# Patient Record
Sex: Female | Born: 1965
Health system: Southern US, Community
[De-identification: ages and names within clinical notes are randomized; demographics above are authoritative.]

## PROBLEM LIST (undated history)

## (undated) DIAGNOSIS — E7800 Pure hypercholesterolemia, unspecified: Secondary | ICD-10-CM

## (undated) DIAGNOSIS — A6000 Herpesviral infection of urogenital system, unspecified: Secondary | ICD-10-CM

## (undated) DIAGNOSIS — E079 Disorder of thyroid, unspecified: Secondary | ICD-10-CM

## (undated) DIAGNOSIS — T883XXA Malignant hyperthermia due to anesthesia, initial encounter: Secondary | ICD-10-CM

## (undated) DIAGNOSIS — H409 Unspecified glaucoma: Secondary | ICD-10-CM

## (undated) DIAGNOSIS — K219 Gastro-esophageal reflux disease without esophagitis: Secondary | ICD-10-CM

## (undated) DIAGNOSIS — M1990 Unspecified osteoarthritis, unspecified site: Secondary | ICD-10-CM

## (undated) DIAGNOSIS — F32A Depression, unspecified: Secondary | ICD-10-CM

## (undated) DIAGNOSIS — I10 Essential (primary) hypertension: Secondary | ICD-10-CM

## (undated) DIAGNOSIS — F329 Major depressive disorder, single episode, unspecified: Secondary | ICD-10-CM

## (undated) DIAGNOSIS — D219 Benign neoplasm of connective and other soft tissue, unspecified: Secondary | ICD-10-CM

## (undated) DIAGNOSIS — Z8489 Family history of other specified conditions: Secondary | ICD-10-CM

## (undated) HISTORY — DX: Gastro-esophageal reflux disease without esophagitis: K21.9

## (undated) HISTORY — DX: Depression, unspecified: F32.A

## (undated) HISTORY — DX: Disorder of thyroid, unspecified: E07.9

## (undated) HISTORY — DX: Pure hypercholesterolemia, unspecified: E78.00

## (undated) HISTORY — PX: ESOPHAGOGASTRODUODENOSCOPY: SHX1529

## (undated) HISTORY — DX: Benign neoplasm of connective and other soft tissue, unspecified: D21.9

## (undated) HISTORY — DX: Unspecified glaucoma: H40.9

## (undated) HISTORY — DX: Essential (primary) hypertension: I10

## (undated) HISTORY — DX: Herpesviral infection of urogenital system, unspecified: A60.00

## (undated) HISTORY — PX: CYSTOSCOPY: SUR368

## (undated) HISTORY — DX: Major depressive disorder, single episode, unspecified: F32.9

---

## 2007-09-11 ENCOUNTER — Ambulatory Visit: Payer: Self-pay | Admitting: Unknown Physician Specialty

## 2008-09-09 ENCOUNTER — Ambulatory Visit: Payer: Self-pay | Admitting: Unknown Physician Specialty

## 2008-09-14 ENCOUNTER — Ambulatory Visit: Payer: Self-pay | Admitting: Unknown Physician Specialty

## 2008-09-18 ENCOUNTER — Ambulatory Visit: Payer: Self-pay | Admitting: Unknown Physician Specialty

## 2009-10-15 ENCOUNTER — Ambulatory Visit: Payer: Self-pay | Admitting: Unknown Physician Specialty

## 2010-08-03 ENCOUNTER — Emergency Department: Payer: Self-pay | Admitting: Unknown Physician Specialty

## 2010-11-01 ENCOUNTER — Ambulatory Visit: Payer: Self-pay | Admitting: Internal Medicine

## 2011-11-02 ENCOUNTER — Ambulatory Visit: Payer: Self-pay | Admitting: Internal Medicine

## 2011-11-07 ENCOUNTER — Ambulatory Visit: Payer: Self-pay | Admitting: Internal Medicine

## 2011-11-14 ENCOUNTER — Ambulatory Visit: Payer: Self-pay | Admitting: Psychiatry

## 2012-04-07 ENCOUNTER — Emergency Department: Payer: Self-pay | Admitting: Emergency Medicine

## 2012-11-04 ENCOUNTER — Ambulatory Visit: Payer: Self-pay | Admitting: Internal Medicine

## 2012-11-05 ENCOUNTER — Ambulatory Visit: Payer: Self-pay | Admitting: Internal Medicine

## 2014-01-02 ENCOUNTER — Ambulatory Visit: Payer: Self-pay | Admitting: Internal Medicine

## 2014-01-26 DIAGNOSIS — G44221 Chronic tension-type headache, intractable: Secondary | ICD-10-CM | POA: Insufficient documentation

## 2014-02-03 DIAGNOSIS — E7800 Pure hypercholesterolemia, unspecified: Secondary | ICD-10-CM | POA: Insufficient documentation

## 2014-02-24 HISTORY — PX: COLONOSCOPY: SHX174

## 2014-03-16 ENCOUNTER — Ambulatory Visit: Payer: Self-pay | Admitting: Unknown Physician Specialty

## 2014-03-18 ENCOUNTER — Ambulatory Visit: Payer: Self-pay | Admitting: Neurology

## 2014-07-20 LAB — SURGICAL PATHOLOGY

## 2014-11-12 LAB — HM PAP SMEAR: HM Pap smear: NEGATIVE

## 2014-12-21 ENCOUNTER — Other Ambulatory Visit: Payer: Self-pay | Admitting: Internal Medicine

## 2014-12-21 DIAGNOSIS — Z1231 Encounter for screening mammogram for malignant neoplasm of breast: Secondary | ICD-10-CM

## 2015-01-04 ENCOUNTER — Ambulatory Visit
Admission: RE | Admit: 2015-01-04 | Discharge: 2015-01-04 | Disposition: A | Discharge status: Home / Self Care | Payer: 59 | Source: Ambulatory Visit | Attending: Internal Medicine | Admitting: Internal Medicine

## 2015-01-04 DIAGNOSIS — Z1231 Encounter for screening mammogram for malignant neoplasm of breast: Secondary | ICD-10-CM | POA: Insufficient documentation

## 2015-03-23 DIAGNOSIS — R739 Hyperglycemia, unspecified: Secondary | ICD-10-CM | POA: Insufficient documentation

## 2015-04-14 DIAGNOSIS — H524 Presbyopia: Secondary | ICD-10-CM | POA: Diagnosis not present

## 2015-04-17 DIAGNOSIS — G5601 Carpal tunnel syndrome, right upper limb: Secondary | ICD-10-CM | POA: Diagnosis not present

## 2015-05-18 DIAGNOSIS — G5601 Carpal tunnel syndrome, right upper limb: Secondary | ICD-10-CM | POA: Diagnosis not present

## 2015-06-09 DIAGNOSIS — F341 Dysthymic disorder: Secondary | ICD-10-CM | POA: Diagnosis not present

## 2015-06-15 DIAGNOSIS — G5601 Carpal tunnel syndrome, right upper limb: Secondary | ICD-10-CM | POA: Diagnosis not present

## 2015-06-16 DIAGNOSIS — F341 Dysthymic disorder: Secondary | ICD-10-CM | POA: Diagnosis not present

## 2015-06-30 DIAGNOSIS — F341 Dysthymic disorder: Secondary | ICD-10-CM | POA: Diagnosis not present

## 2015-07-14 DIAGNOSIS — F341 Dysthymic disorder: Secondary | ICD-10-CM | POA: Diagnosis not present

## 2015-07-16 DIAGNOSIS — G5601 Carpal tunnel syndrome, right upper limb: Secondary | ICD-10-CM | POA: Diagnosis not present

## 2015-08-11 DIAGNOSIS — F341 Dysthymic disorder: Secondary | ICD-10-CM | POA: Diagnosis not present

## 2015-08-15 DIAGNOSIS — G5601 Carpal tunnel syndrome, right upper limb: Secondary | ICD-10-CM | POA: Diagnosis not present

## 2015-08-25 DIAGNOSIS — I10 Essential (primary) hypertension: Secondary | ICD-10-CM | POA: Diagnosis not present

## 2015-08-25 DIAGNOSIS — Z79899 Other long term (current) drug therapy: Secondary | ICD-10-CM | POA: Diagnosis not present

## 2015-08-25 DIAGNOSIS — R739 Hyperglycemia, unspecified: Secondary | ICD-10-CM | POA: Diagnosis not present

## 2015-08-25 DIAGNOSIS — E7800 Pure hypercholesterolemia, unspecified: Secondary | ICD-10-CM | POA: Diagnosis not present

## 2015-09-09 DIAGNOSIS — L84 Corns and callosities: Secondary | ICD-10-CM | POA: Diagnosis not present

## 2015-09-09 DIAGNOSIS — L304 Erythema intertrigo: Secondary | ICD-10-CM | POA: Diagnosis not present

## 2015-09-09 DIAGNOSIS — L918 Other hypertrophic disorders of the skin: Secondary | ICD-10-CM | POA: Diagnosis not present

## 2015-09-22 DIAGNOSIS — F341 Dysthymic disorder: Secondary | ICD-10-CM | POA: Diagnosis not present

## 2015-10-14 DIAGNOSIS — H40053 Ocular hypertension, bilateral: Secondary | ICD-10-CM | POA: Diagnosis not present

## 2015-10-27 DIAGNOSIS — F341 Dysthymic disorder: Secondary | ICD-10-CM | POA: Diagnosis not present

## 2015-11-30 DIAGNOSIS — F341 Dysthymic disorder: Secondary | ICD-10-CM | POA: Diagnosis not present

## 2015-12-13 DIAGNOSIS — Z3041 Encounter for surveillance of contraceptive pills: Secondary | ICD-10-CM | POA: Diagnosis not present

## 2015-12-13 DIAGNOSIS — D219 Benign neoplasm of connective and other soft tissue, unspecified: Secondary | ICD-10-CM | POA: Diagnosis not present

## 2015-12-13 DIAGNOSIS — Z01419 Encounter for gynecological examination (general) (routine) without abnormal findings: Secondary | ICD-10-CM | POA: Diagnosis not present

## 2015-12-13 DIAGNOSIS — Z1239 Encounter for other screening for malignant neoplasm of breast: Secondary | ICD-10-CM | POA: Diagnosis not present

## 2016-01-03 DIAGNOSIS — F341 Dysthymic disorder: Secondary | ICD-10-CM | POA: Diagnosis not present

## 2016-01-06 DIAGNOSIS — H40053 Ocular hypertension, bilateral: Secondary | ICD-10-CM | POA: Diagnosis not present

## 2016-01-17 DIAGNOSIS — F341 Dysthymic disorder: Secondary | ICD-10-CM | POA: Diagnosis not present

## 2016-01-31 DIAGNOSIS — F341 Dysthymic disorder: Secondary | ICD-10-CM | POA: Diagnosis not present

## 2016-02-24 DIAGNOSIS — F341 Dysthymic disorder: Secondary | ICD-10-CM | POA: Diagnosis not present

## 2016-02-24 DIAGNOSIS — Z01419 Encounter for gynecological examination (general) (routine) without abnormal findings: Secondary | ICD-10-CM | POA: Diagnosis not present

## 2016-03-01 ENCOUNTER — Other Ambulatory Visit: Payer: Self-pay | Admitting: Internal Medicine

## 2016-03-06 DIAGNOSIS — J069 Acute upper respiratory infection, unspecified: Secondary | ICD-10-CM | POA: Diagnosis not present

## 2016-03-13 DIAGNOSIS — F341 Dysthymic disorder: Secondary | ICD-10-CM | POA: Diagnosis not present

## 2016-03-13 DIAGNOSIS — J069 Acute upper respiratory infection, unspecified: Secondary | ICD-10-CM | POA: Diagnosis not present

## 2016-04-03 DIAGNOSIS — F341 Dysthymic disorder: Secondary | ICD-10-CM | POA: Diagnosis not present

## 2016-05-08 DIAGNOSIS — F341 Dysthymic disorder: Secondary | ICD-10-CM | POA: Diagnosis not present

## 2016-05-24 DIAGNOSIS — H524 Presbyopia: Secondary | ICD-10-CM | POA: Diagnosis not present

## 2016-06-08 ENCOUNTER — Other Ambulatory Visit
Admission: RE | Admit: 2016-06-08 | Discharge: 2016-06-08 | Disposition: A | Discharge status: Home / Self Care | Payer: 59 | Source: Ambulatory Visit | Attending: Psychiatry | Admitting: Psychiatry

## 2016-06-08 DIAGNOSIS — F332 Major depressive disorder, recurrent severe without psychotic features: Secondary | ICD-10-CM | POA: Insufficient documentation

## 2016-06-08 DIAGNOSIS — F341 Dysthymic disorder: Secondary | ICD-10-CM | POA: Diagnosis not present

## 2016-06-08 DIAGNOSIS — I10 Essential (primary) hypertension: Secondary | ICD-10-CM | POA: Diagnosis not present

## 2016-06-08 LAB — LIPID PANEL
CHOL/HDL RATIO: 3.2 ratio
Cholesterol: 198 mg/dL (ref 0–200)
HDL: 62 mg/dL (ref >40)
LDL CALC: 117 mg/dL — AB (ref 0–99)
Triglycerides: 93 mg/dL (ref <150)
VLDL: 19 mg/dL (ref 0–40)

## 2016-06-08 LAB — TSH: TSH: 5.489 u[IU]/mL — ABNORMAL HIGH (ref 0.350–4.500)

## 2016-06-08 LAB — COMPREHENSIVE METABOLIC PANEL
ALBUMIN: 3.8 g/dL (ref 3.5–5.0)
ALT: 29 U/L (ref 14–54)
AST: 23 U/L (ref 15–41)
Alkaline Phosphatase: 44 U/L (ref 38–126)
Anion gap: 8 (ref 5–15)
BUN: 15 mg/dL (ref 6–20)
CHLORIDE: 102 mmol/L (ref 101–111)
CO2: 28 mmol/L (ref 22–32)
Calcium: 8.9 mg/dL (ref 8.9–10.3)
Creatinine, Ser: 0.99 mg/dL (ref 0.44–1.00)
GFR calc Af Amer: >60 mL/min (ref >60)
GFR calc non Af Amer: >60 mL/min (ref >60)
GLUCOSE: 103 mg/dL — AB (ref 65–99)
POTASSIUM: 3.1 mmol/L — AB (ref 3.5–5.1)
Sodium: 138 mmol/L (ref 135–145)
Total Bilirubin: 0.4 mg/dL (ref 0.3–1.2)
Total Protein: 7.4 g/dL (ref 6.5–8.1)

## 2016-06-08 LAB — CBC WITH DIFFERENTIAL/PLATELET
BASOS PCT: 1 %
Basophils Absolute: 0.1 10*3/uL (ref 0–0.1)
EOS ABS: 0.2 10*3/uL (ref 0–0.7)
Eosinophils Relative: 2 %
HCT: 37.1 % (ref 35.0–47.0)
HEMOGLOBIN: 12.3 g/dL (ref 12.0–16.0)
Lymphocytes Relative: 22 %
Lymphs Abs: 2.1 10*3/uL (ref 1.0–3.6)
MCH: 27 pg (ref 26.0–34.0)
MCHC: 33.1 g/dL (ref 32.0–36.0)
MCV: 81.7 fL (ref 80.0–100.0)
Monocytes Absolute: 0.6 10*3/uL (ref 0.2–0.9)
Monocytes Relative: 7 %
NEUTROS PCT: 68 %
Neutro Abs: 6.3 10*3/uL (ref 1.4–6.5)
Platelets: 382 10*3/uL (ref 150–440)
RBC: 4.55 MIL/uL (ref 3.80–5.20)
RDW: 14.1 % (ref 11.5–14.5)
WBC: 9.3 10*3/uL (ref 3.6–11.0)

## 2016-06-26 DIAGNOSIS — F341 Dysthymic disorder: Secondary | ICD-10-CM | POA: Diagnosis not present

## 2016-07-17 DIAGNOSIS — F341 Dysthymic disorder: Secondary | ICD-10-CM | POA: Diagnosis not present

## 2016-08-15 DIAGNOSIS — F341 Dysthymic disorder: Secondary | ICD-10-CM | POA: Diagnosis not present

## 2016-09-26 DIAGNOSIS — M1990 Unspecified osteoarthritis, unspecified site: Secondary | ICD-10-CM | POA: Diagnosis not present

## 2016-09-26 DIAGNOSIS — I10 Essential (primary) hypertension: Secondary | ICD-10-CM | POA: Diagnosis not present

## 2016-09-26 DIAGNOSIS — Z0000 Encounter for general adult medical examination without abnormal findings: Secondary | ICD-10-CM | POA: Diagnosis not present

## 2016-09-26 DIAGNOSIS — G44221 Chronic tension-type headache, intractable: Secondary | ICD-10-CM | POA: Diagnosis not present

## 2016-09-26 DIAGNOSIS — Z1329 Encounter for screening for other suspected endocrine disorder: Secondary | ICD-10-CM | POA: Diagnosis not present

## 2016-09-26 DIAGNOSIS — R739 Hyperglycemia, unspecified: Secondary | ICD-10-CM | POA: Diagnosis not present

## 2016-09-26 DIAGNOSIS — Z79899 Other long term (current) drug therapy: Secondary | ICD-10-CM | POA: Diagnosis not present

## 2016-09-26 DIAGNOSIS — M2550 Pain in unspecified joint: Secondary | ICD-10-CM | POA: Diagnosis not present

## 2016-09-28 ENCOUNTER — Other Ambulatory Visit: Payer: Self-pay | Admitting: Internal Medicine

## 2016-09-28 DIAGNOSIS — Z1231 Encounter for screening mammogram for malignant neoplasm of breast: Secondary | ICD-10-CM

## 2016-10-10 DIAGNOSIS — M1990 Unspecified osteoarthritis, unspecified site: Secondary | ICD-10-CM | POA: Insufficient documentation

## 2016-10-10 DIAGNOSIS — M19041 Primary osteoarthritis, right hand: Secondary | ICD-10-CM | POA: Diagnosis not present

## 2016-10-10 DIAGNOSIS — M79641 Pain in right hand: Secondary | ICD-10-CM | POA: Diagnosis not present

## 2016-10-10 DIAGNOSIS — M79642 Pain in left hand: Secondary | ICD-10-CM | POA: Diagnosis not present

## 2016-10-10 DIAGNOSIS — M19042 Primary osteoarthritis, left hand: Secondary | ICD-10-CM | POA: Diagnosis not present

## 2016-10-13 ENCOUNTER — Ambulatory Visit
Admission: RE | Admit: 2016-10-13 | Discharge: 2016-10-13 | Disposition: A | Discharge status: Home / Self Care | Payer: 59 | Source: Ambulatory Visit | Attending: Internal Medicine | Admitting: Internal Medicine

## 2016-10-13 ENCOUNTER — Other Ambulatory Visit: Payer: Self-pay | Admitting: Internal Medicine

## 2016-10-13 DIAGNOSIS — Z1231 Encounter for screening mammogram for malignant neoplasm of breast: Secondary | ICD-10-CM | POA: Insufficient documentation

## 2016-10-13 LAB — HM MAMMOGRAPHY: HM Mammogram: NORMAL (ref 0–4)

## 2016-10-16 DIAGNOSIS — F341 Dysthymic disorder: Secondary | ICD-10-CM | POA: Diagnosis not present

## 2016-11-15 DIAGNOSIS — I10 Essential (primary) hypertension: Secondary | ICD-10-CM | POA: Diagnosis not present

## 2016-11-23 DIAGNOSIS — F341 Dysthymic disorder: Secondary | ICD-10-CM | POA: Diagnosis not present

## 2016-12-04 ENCOUNTER — Telehealth: Payer: Self-pay | Admitting: Obstetrics and Gynecology

## 2016-12-04 ENCOUNTER — Other Ambulatory Visit: Payer: Self-pay | Admitting: Obstetrics and Gynecology

## 2016-12-04 MED ORDER — NORETHINDRONE 0.35 MG PO TABS: 1.0000 | ORAL_TABLET | Freq: Every day | ORAL | 0 refills | Status: DC

## 2016-12-04 NOTE — Telephone Encounter (Signed)
Please advise 

## 2016-12-04 NOTE — Telephone Encounter (Signed)
norethindrone (contraceptive) 0.35 mg oral tablet was rx'd 12/13/15 at last AE. Not in EPIC to refill. Please Rx.

## 2016-12-04 NOTE — Telephone Encounter (Signed)
Pt is schedule 12/14/16 with [facility] and is requesting an refill o her Birthcontrol. [facility]

## 2016-12-04 NOTE — Telephone Encounter (Signed)
Rx eRxd.  

## 2016-12-04 NOTE — Progress Notes (Signed)
POP RF till annual.

## 2016-12-06 DIAGNOSIS — H40053 Ocular hypertension, bilateral: Secondary | ICD-10-CM | POA: Diagnosis not present

## 2016-12-14 ENCOUNTER — Other Ambulatory Visit: Payer: Self-pay

## 2016-12-14 ENCOUNTER — Ambulatory Visit: Payer: Self-pay | Admitting: Obstetrics and Gynecology

## 2016-12-14 MED ORDER — NORETHINDRONE 0.35 MG PO TABS: 1.0000 | ORAL_TABLET | Freq: Every day | ORAL | 0 refills | Status: DC

## 2016-12-14 NOTE — Telephone Encounter (Signed)
Refilled OCP's to last to pt annual 10/25

## 2016-12-18 DIAGNOSIS — F341 Dysthymic disorder: Secondary | ICD-10-CM | POA: Diagnosis not present

## 2016-12-28 ENCOUNTER — Telehealth: Payer: Self-pay

## 2016-12-28 MED ORDER — NORETHINDRONE 0.35 MG PO TABS: 1.0000 | ORAL_TABLET | Freq: Every day | ORAL | 0 refills | Status: DC

## 2016-12-28 NOTE — Telephone Encounter (Signed)
Pt's annual was resch to 10/24th and needs bc refill to them.  Pt aware refill eRx'd.

## 2017-01-15 DIAGNOSIS — F341 Dysthymic disorder: Secondary | ICD-10-CM | POA: Diagnosis not present

## 2017-01-18 ENCOUNTER — Encounter: Payer: Self-pay | Admitting: Obstetrics and Gynecology

## 2017-01-18 ENCOUNTER — Ambulatory Visit (INDEPENDENT_AMBULATORY_CARE_PROVIDER_SITE_OTHER): Payer: 59 | Admitting: Obstetrics and Gynecology

## 2017-01-18 VITALS — BP 136/82 | HR 71 | Ht 60.0 in | Wt 191.0 lb

## 2017-01-18 DIAGNOSIS — Z3041 Encounter for surveillance of contraceptive pills: Secondary | ICD-10-CM

## 2017-01-18 DIAGNOSIS — D219 Benign neoplasm of connective and other soft tissue, unspecified: Secondary | ICD-10-CM | POA: Diagnosis not present

## 2017-01-18 DIAGNOSIS — Z113 Encounter for screening for infections with a predominantly sexual mode of transmission: Secondary | ICD-10-CM

## 2017-01-18 DIAGNOSIS — Z1239 Encounter for other screening for malignant neoplasm of breast: Secondary | ICD-10-CM

## 2017-01-18 DIAGNOSIS — Z1231 Encounter for screening mammogram for malignant neoplasm of breast: Secondary | ICD-10-CM | POA: Diagnosis not present

## 2017-01-18 DIAGNOSIS — Z01419 Encounter for gynecological examination (general) (routine) without abnormal findings: Secondary | ICD-10-CM

## 2017-01-18 MED ORDER — NORETHINDRONE 0.35 MG PO TABS: 1.0000 | ORAL_TABLET | Freq: Every day | ORAL | 12 refills | Status: DC

## 2017-01-18 NOTE — Progress Notes (Signed)
PCP:  Idelle Crouch, MD   Chief Complaint  Patient presents with  . Gynecologic Exam     HPI:      [name] is a 51 y.o. G0P0000 who LMP was No LMP recorded. Patient is perimenopausal., presents today for her annual examination.  Her menses are regular every 28-30 days, lasting 6 days total but only 3 days of med flow bleeding.  Dysmenorrhea mild, occurring first 1-2 days of flow. She does not have intermenstrual bleeding. Pt on camila for cycle control with leio.  Sex activity: not sexually active.  Last Pap: November 12, 2014  Results were: no abnormalities /neg HPV DNA  Hx of STDs: HSV on labs only, never any sx. She would like full STD testing today just to be safe.  Last mammogram: October 13, 2016  with PCP. Results were: normal--routine follow-up in 12 months There is no FH of breast cancer. There is no FH of ovarian cancer. The patient does do self-breast exams.  Tobacco use: The patient denies current or previous tobacco use. Alcohol use: social drinker No drug use.  Exercise: not active  She does get adequate calcium but not Vitamin D in her diet.  Labs with PCP. Seeing psych for depression/medications.    Past Medical History:  Diagnosis Date  . Depression   . Esophageal reflux   . Fibroids   . Glaucoma   . Herpes genitalia   . Hypercholesteremia   . Hypertension   . Thyroid disease     Past Surgical History:  Procedure Laterality Date  . COLONOSCOPY  02/2014   Dr.Elliott    Family History  Problem Relation Age of Onset  . Dementia Mother   . Hypertension Mother     Social History   Social History  . Marital status: Single    Spouse name: N/A  . Number of children: N/A  . Years of education: N/A   Occupational History  . Not on file.   Social History Main Topics  . Smoking status: Never Smoker  . Smokeless tobacco: Never Used  . Alcohol use Yes  . Drug use: No  . Sexual activity: Not Currently    Partners: Male    Birth  control/ protection: None   Other Topics Concern  . Not on file   Social History Narrative  . No narrative on file    Current Meds  Medication Sig  . ALPRAZolam (XANAX) 0.25 MG tablet   . buPROPion (WELLBUTRIN SR) 150 MG 12 hr tablet Take two pills in the morning, one at night.  . etodolac (LODINE) 400 MG tablet Take by mouth.  . methocarbamol (ROBAXIN) 500 MG tablet Take by mouth.  . norethindrone (MICRONOR,CAMILA,ERRIN) 0.35 MG tablet Take 1 tablet (0.35 mg total) by mouth daily.  Marland Kitchen omeprazole (PRILOSEC) 20 MG capsule Take by mouth.  . propranolol ER (INDERAL LA) 60 MG 24 hr capsule   . sertraline (ZOLOFT) 50 MG tablet Take by mouth.  . simvastatin (ZOCOR) 40 MG tablet   . travoprost, benzalkonium, (TRAVATAN) 0.004 % ophthalmic solution Apply to eye.  . [DISCONTINUED] norethindrone (MICRONOR,CAMILA,ERRIN) 0.35 MG tablet Take 1 tablet (0.35 mg total) by mouth daily.  . [DISCONTINUED] norethindrone (MICRONOR,CAMILA,ERRIN) 0.35 MG tablet Take by mouth.     ROS:  Review of Systems  Constitutional: Negative for fatigue, fever and unexpected weight change.  Respiratory: Negative for cough, shortness of breath and wheezing.   Cardiovascular: Negative for chest pain, palpitations and leg swelling.  Gastrointestinal: Negative for blood in stool, constipation, diarrhea, nausea and vomiting.  Endocrine: Negative for cold intolerance, heat intolerance and polyuria.  Genitourinary: Negative for dyspareunia, dysuria, flank pain, frequency, genital sores, hematuria, menstrual problem, pelvic pain, urgency, vaginal bleeding, vaginal discharge and vaginal pain.  Musculoskeletal: Negative for back pain, joint swelling and myalgias.  Skin: Negative for rash.  Neurological: Negative for dizziness, syncope, light-headedness, numbness and headaches.  Hematological: Negative for adenopathy.  Psychiatric/Behavioral: Positive for dysphoric mood. Negative for agitation, confusion, sleep disturbance  and suicidal ideas. The patient is not nervous/anxious.      Objective: BP 136/82 (BP Location: Left Arm, Patient Position: Sitting, Cuff Size: Normal)   Pulse 71   Ht 5' (1.524 m)   Wt 191 lb (86.6 kg)   BMI 37.30 kg/m    Physical Exam  Constitutional: She is oriented to person, place, and time. She appears well-developed and well-nourished.  Genitourinary: Vagina normal and uterus normal. There is no rash or tenderness on the right labia. There is no rash or tenderness on the left labia. No erythema or tenderness in the vagina. No vaginal discharge found. Right adnexum does not display mass and does not display tenderness. Left adnexum does not display mass and does not display tenderness. Cervix does not exhibit motion tenderness or polyp. Uterus is not enlarged or tender.  Neck: Normal range of motion. No thyromegaly present.  Cardiovascular: Normal rate, regular rhythm and normal heart sounds.   No murmur heard. Pulmonary/Chest: Effort normal and breath sounds normal. Right breast exhibits no mass, no nipple discharge, no skin change and no tenderness. Left breast exhibits no mass, no nipple discharge, no skin change and no tenderness.  Abdominal: Soft. There is no tenderness. There is no guarding.  Musculoskeletal: Normal range of motion.  Neurological: She is alert and oriented to person, place, and time. No cranial nerve deficit.  Psychiatric: She has a normal mood and affect. Her behavior is normal.  Vitals reviewed.  RESULTS:  GAD-7=2 PHQ-9=7  Assessment/Plan: Encounter for annual routine gynecological examination  Screening for breast cancer - Pt current on mammo through PCP  Encounter for surveillance of contraceptive pills - OCP RF. - Plan: norethindrone (MICRONOR,CAMILA,ERRIN) 0.35 MG tablet  Screening for STD (sexually transmitted disease) - Plan: HIV antibody, RPR, Hepatitis C antibody, Chlamydia/Gonococcus/Trichomonas, NAA  Leiomyoma - Bleeding controlled with  POPs           GYN counsel breast self exam, mammography screening, adequate intake of calcium and vitamin D, diet and exercise     F/U  Return in about 1 year (around 01/18/2018).  Leverne Tessler B. Darlisha Kelm, PA-C 01/18/2017 10:29 AM

## 2017-01-19 LAB — HEPATITIS C ANTIBODY

## 2017-01-19 LAB — HIV ANTIBODY (ROUTINE TESTING W REFLEX): HIV SCREEN 4TH GENERATION: NONREACTIVE

## 2017-01-19 LAB — RPR: RPR Ser Ql: NONREACTIVE

## 2017-01-20 ENCOUNTER — Encounter: Payer: Self-pay | Admitting: Obstetrics and Gynecology

## 2017-01-20 LAB — CHLAMYDIA/GONOCOCCUS/TRICHOMONAS, NAA
CHLAMYDIA BY NAA: NEGATIVE
Gonococcus by NAA: NEGATIVE
Trich vag by NAA: NEGATIVE

## 2017-02-12 DIAGNOSIS — F341 Dysthymic disorder: Secondary | ICD-10-CM | POA: Diagnosis not present

## 2017-02-21 DIAGNOSIS — I10 Essential (primary) hypertension: Secondary | ICD-10-CM | POA: Diagnosis not present

## 2017-02-21 DIAGNOSIS — E7800 Pure hypercholesterolemia, unspecified: Secondary | ICD-10-CM | POA: Diagnosis not present

## 2017-02-21 DIAGNOSIS — M79641 Pain in right hand: Secondary | ICD-10-CM | POA: Diagnosis not present

## 2017-02-21 DIAGNOSIS — R739 Hyperglycemia, unspecified: Secondary | ICD-10-CM | POA: Diagnosis not present

## 2017-02-22 DIAGNOSIS — R002 Palpitations: Secondary | ICD-10-CM | POA: Diagnosis not present

## 2017-02-22 DIAGNOSIS — E7800 Pure hypercholesterolemia, unspecified: Secondary | ICD-10-CM | POA: Diagnosis not present

## 2017-02-22 DIAGNOSIS — R0789 Other chest pain: Secondary | ICD-10-CM | POA: Diagnosis not present

## 2017-02-22 DIAGNOSIS — I10 Essential (primary) hypertension: Secondary | ICD-10-CM | POA: Diagnosis not present

## 2017-02-23 ENCOUNTER — Other Ambulatory Visit: Payer: Self-pay | Admitting: Internal Medicine

## 2017-02-23 DIAGNOSIS — R0789 Other chest pain: Secondary | ICD-10-CM

## 2017-02-26 DIAGNOSIS — M19041 Primary osteoarthritis, right hand: Secondary | ICD-10-CM | POA: Diagnosis not present

## 2017-02-26 DIAGNOSIS — M19042 Primary osteoarthritis, left hand: Secondary | ICD-10-CM | POA: Diagnosis not present

## 2017-03-01 ENCOUNTER — Ambulatory Visit
Admission: RE | Admit: 2017-03-01 | Discharge: 2017-03-01 | Disposition: A | Discharge status: Home / Self Care | Payer: 59 | Source: Ambulatory Visit | Attending: Internal Medicine | Admitting: Internal Medicine

## 2017-03-01 DIAGNOSIS — R0789 Other chest pain: Secondary | ICD-10-CM | POA: Insufficient documentation

## 2017-03-12 DIAGNOSIS — R0789 Other chest pain: Secondary | ICD-10-CM | POA: Diagnosis not present

## 2017-03-12 DIAGNOSIS — F341 Dysthymic disorder: Secondary | ICD-10-CM | POA: Diagnosis not present

## 2017-03-12 DIAGNOSIS — I10 Essential (primary) hypertension: Secondary | ICD-10-CM | POA: Diagnosis not present

## 2017-03-12 DIAGNOSIS — E7800 Pure hypercholesterolemia, unspecified: Secondary | ICD-10-CM | POA: Diagnosis not present

## 2017-04-09 DIAGNOSIS — F341 Dysthymic disorder: Secondary | ICD-10-CM | POA: Diagnosis not present

## 2017-04-26 LAB — EXERCISE TOLERANCE TEST
CSEPEW: 7.6 METS
Exercise duration (min): 6 min
Exercise duration (sec): 27 s
MPHR: 172 {beats}/min
Peak HR: 148 {beats}/min
Percent HR: 86 %
Rest HR: 72 {beats}/min

## 2017-05-17 DIAGNOSIS — I10 Essential (primary) hypertension: Secondary | ICD-10-CM | POA: Diagnosis not present

## 2017-05-17 DIAGNOSIS — Z79899 Other long term (current) drug therapy: Secondary | ICD-10-CM | POA: Diagnosis not present

## 2017-05-17 DIAGNOSIS — E7800 Pure hypercholesterolemia, unspecified: Secondary | ICD-10-CM | POA: Diagnosis not present

## 2017-05-17 DIAGNOSIS — R739 Hyperglycemia, unspecified: Secondary | ICD-10-CM | POA: Diagnosis not present

## 2017-05-24 DIAGNOSIS — F341 Dysthymic disorder: Secondary | ICD-10-CM | POA: Diagnosis not present

## 2017-05-24 DIAGNOSIS — I10 Essential (primary) hypertension: Secondary | ICD-10-CM | POA: Diagnosis not present

## 2017-05-24 DIAGNOSIS — R739 Hyperglycemia, unspecified: Secondary | ICD-10-CM | POA: Diagnosis not present

## 2017-05-24 DIAGNOSIS — M545 Low back pain: Secondary | ICD-10-CM | POA: Diagnosis not present

## 2017-05-24 DIAGNOSIS — E7800 Pure hypercholesterolemia, unspecified: Secondary | ICD-10-CM | POA: Diagnosis not present

## 2017-05-31 DIAGNOSIS — H524 Presbyopia: Secondary | ICD-10-CM | POA: Diagnosis not present

## 2017-06-21 DIAGNOSIS — F341 Dysthymic disorder: Secondary | ICD-10-CM | POA: Diagnosis not present

## 2017-08-13 DIAGNOSIS — F341 Dysthymic disorder: Secondary | ICD-10-CM | POA: Diagnosis not present

## 2017-08-21 DIAGNOSIS — E7800 Pure hypercholesterolemia, unspecified: Secondary | ICD-10-CM | POA: Diagnosis not present

## 2017-08-21 DIAGNOSIS — I10 Essential (primary) hypertension: Secondary | ICD-10-CM | POA: Diagnosis not present

## 2017-08-21 DIAGNOSIS — R739 Hyperglycemia, unspecified: Secondary | ICD-10-CM | POA: Diagnosis not present

## 2017-08-21 DIAGNOSIS — K219 Gastro-esophageal reflux disease without esophagitis: Secondary | ICD-10-CM | POA: Diagnosis not present

## 2017-08-27 DIAGNOSIS — M25562 Pain in left knee: Secondary | ICD-10-CM | POA: Diagnosis not present

## 2017-08-27 DIAGNOSIS — M19041 Primary osteoarthritis, right hand: Secondary | ICD-10-CM | POA: Diagnosis not present

## 2017-08-27 DIAGNOSIS — M19042 Primary osteoarthritis, left hand: Secondary | ICD-10-CM | POA: Diagnosis not present

## 2017-08-27 DIAGNOSIS — M1712 Unilateral primary osteoarthritis, left knee: Secondary | ICD-10-CM | POA: Diagnosis not present

## 2017-09-12 DIAGNOSIS — I10 Essential (primary) hypertension: Secondary | ICD-10-CM | POA: Diagnosis not present

## 2017-09-12 DIAGNOSIS — E7800 Pure hypercholesterolemia, unspecified: Secondary | ICD-10-CM | POA: Diagnosis not present

## 2017-09-20 DIAGNOSIS — L821 Other seborrheic keratosis: Secondary | ICD-10-CM | POA: Diagnosis not present

## 2017-09-20 DIAGNOSIS — L304 Erythema intertrigo: Secondary | ICD-10-CM | POA: Diagnosis not present

## 2017-09-24 DIAGNOSIS — F331 Major depressive disorder, recurrent, moderate: Secondary | ICD-10-CM | POA: Diagnosis not present

## 2017-09-24 DIAGNOSIS — F341 Dysthymic disorder: Secondary | ICD-10-CM | POA: Diagnosis not present

## 2017-10-22 DIAGNOSIS — F341 Dysthymic disorder: Secondary | ICD-10-CM | POA: Diagnosis not present

## 2017-10-23 DIAGNOSIS — H6123 Impacted cerumen, bilateral: Secondary | ICD-10-CM | POA: Diagnosis not present

## 2017-10-23 DIAGNOSIS — H9190 Unspecified hearing loss, unspecified ear: Secondary | ICD-10-CM | POA: Diagnosis not present

## 2017-10-30 DIAGNOSIS — H9190 Unspecified hearing loss, unspecified ear: Secondary | ICD-10-CM | POA: Diagnosis not present

## 2017-10-30 DIAGNOSIS — H93299 Other abnormal auditory perceptions, unspecified ear: Secondary | ICD-10-CM | POA: Diagnosis not present

## 2017-10-30 DIAGNOSIS — L700 Acne vulgaris: Secondary | ICD-10-CM | POA: Diagnosis not present

## 2017-11-23 ENCOUNTER — Other Ambulatory Visit: Payer: Self-pay | Admitting: Internal Medicine

## 2017-11-23 DIAGNOSIS — Z1231 Encounter for screening mammogram for malignant neoplasm of breast: Secondary | ICD-10-CM

## 2017-12-10 DIAGNOSIS — F341 Dysthymic disorder: Secondary | ICD-10-CM | POA: Diagnosis not present

## 2017-12-12 ENCOUNTER — Ambulatory Visit
Admission: RE | Admit: 2017-12-12 | Discharge: 2017-12-12 | Disposition: A | Discharge status: Home / Self Care | Payer: 59 | Source: Ambulatory Visit | Attending: Internal Medicine | Admitting: Internal Medicine

## 2017-12-12 DIAGNOSIS — Z1231 Encounter for screening mammogram for malignant neoplasm of breast: Secondary | ICD-10-CM | POA: Diagnosis not present

## 2017-12-19 DIAGNOSIS — H40053 Ocular hypertension, bilateral: Secondary | ICD-10-CM | POA: Diagnosis not present

## 2018-01-03 ENCOUNTER — Other Ambulatory Visit: Payer: Self-pay

## 2018-01-03 DIAGNOSIS — Z3041 Encounter for surveillance of contraceptive pills: Secondary | ICD-10-CM

## 2018-01-03 MED ORDER — NORETHINDRONE 0.35 MG PO TABS: 1.0000 | ORAL_TABLET | Freq: Every day | ORAL | 0 refills | Status: DC

## 2018-01-08 DIAGNOSIS — Z0000 Encounter for general adult medical examination without abnormal findings: Secondary | ICD-10-CM | POA: Diagnosis not present

## 2018-01-08 DIAGNOSIS — I10 Essential (primary) hypertension: Secondary | ICD-10-CM | POA: Diagnosis not present

## 2018-01-08 DIAGNOSIS — R739 Hyperglycemia, unspecified: Secondary | ICD-10-CM | POA: Diagnosis not present

## 2018-01-08 DIAGNOSIS — E7800 Pure hypercholesterolemia, unspecified: Secondary | ICD-10-CM | POA: Diagnosis not present

## 2018-01-08 DIAGNOSIS — Z79899 Other long term (current) drug therapy: Secondary | ICD-10-CM | POA: Diagnosis not present

## 2018-01-28 ENCOUNTER — Ambulatory Visit: Payer: 59 | Admitting: Obstetrics and Gynecology

## 2018-01-28 DIAGNOSIS — F341 Dysthymic disorder: Secondary | ICD-10-CM | POA: Diagnosis not present

## 2018-02-13 ENCOUNTER — Ambulatory Visit: Payer: 59 | Admitting: Obstetrics and Gynecology

## 2018-02-13 ENCOUNTER — Other Ambulatory Visit: Payer: Self-pay

## 2018-02-13 DIAGNOSIS — Z3041 Encounter for surveillance of contraceptive pills: Secondary | ICD-10-CM

## 2018-02-13 MED ORDER — NORETHINDRONE 0.35 MG PO TABS: 1.0000 | ORAL_TABLET | Freq: Every day | ORAL | 0 refills | Status: DC

## 2018-02-25 DIAGNOSIS — F341 Dysthymic disorder: Secondary | ICD-10-CM | POA: Diagnosis not present

## 2018-02-26 DIAGNOSIS — M19041 Primary osteoarthritis, right hand: Secondary | ICD-10-CM | POA: Diagnosis not present

## 2018-02-26 DIAGNOSIS — M25562 Pain in left knee: Secondary | ICD-10-CM | POA: Diagnosis not present

## 2018-02-26 DIAGNOSIS — M79642 Pain in left hand: Secondary | ICD-10-CM | POA: Diagnosis not present

## 2018-02-26 DIAGNOSIS — M79641 Pain in right hand: Secondary | ICD-10-CM | POA: Diagnosis not present

## 2018-03-13 ENCOUNTER — Encounter: Payer: Self-pay | Admitting: Obstetrics and Gynecology

## 2018-03-13 ENCOUNTER — Other Ambulatory Visit (HOSPITAL_COMMUNITY)
Admission: RE | Admit: 2018-03-13 | Discharge: 2018-03-13 | Disposition: A | Discharge status: Home / Self Care | Payer: 59 | Source: Ambulatory Visit | Attending: Obstetrics and Gynecology | Admitting: Obstetrics and Gynecology

## 2018-03-13 ENCOUNTER — Ambulatory Visit (INDEPENDENT_AMBULATORY_CARE_PROVIDER_SITE_OTHER): Payer: 59 | Admitting: Obstetrics and Gynecology

## 2018-03-13 VITALS — BP 140/90 | HR 89 | Ht 60.0 in | Wt 206.0 lb

## 2018-03-13 DIAGNOSIS — Z3041 Encounter for surveillance of contraceptive pills: Secondary | ICD-10-CM

## 2018-03-13 DIAGNOSIS — Z1151 Encounter for screening for human papillomavirus (HPV): Secondary | ICD-10-CM | POA: Diagnosis not present

## 2018-03-13 DIAGNOSIS — Z124 Encounter for screening for malignant neoplasm of cervix: Secondary | ICD-10-CM | POA: Insufficient documentation

## 2018-03-13 DIAGNOSIS — Z01419 Encounter for gynecological examination (general) (routine) without abnormal findings: Secondary | ICD-10-CM

## 2018-03-13 MED ORDER — NORETHINDRONE 0.35 MG PO TABS: 1.0000 | ORAL_TABLET | Freq: Every day | ORAL | 3 refills | Status: DC

## 2018-03-13 NOTE — Patient Instructions (Signed)
I value your feedback and entrusting us with your care. If you get a [facility] patient survey, I would appreciate you taking the time to let us know about your experience today. Thank you! 

## 2018-03-13 NOTE — Progress Notes (Signed)
PCP:  Idelle Crouch, MD   Chief Complaint  Patient presents with  . Gynecologic Exam     HPI:      [name] is a 52 y.o. G0P0000 who LMP was No LMP recorded. Patient is perimenopausal., presents today for her annual examination.  Her menses are irregular now with POPs. Bleeding is light for 4-7 days. Dysmenorrhea mild. She does not have intermenstrual bleeding. Pt on camila for cycle control with leio.  Sex activity: not sexually active. Was earlier this yr. Last Pap: November 12, 2014  Results were: no abnormalities /neg HPV DNA  Hx of STDs: HSV on labs only, never any sx. Neg STD testing otherwise last yr.  Last mammogram: 12/12/17 with PCP. Results were: normal--routine follow-up in 12 months There is no FH of breast cancer. There is no FH of ovarian cancer. The patient does do self-breast exams.  Tobacco use: The patient denies current or previous tobacco use. Alcohol use: social drinker No drug use.  Exercise: not active  Colonoscopy: 2015 without abn findings. Repeat after 10 yrs per pt report.  She does get adequate calcium but not Vitamin D in her diet.  Labs with PCP. Seeing psych for depression/medications.    Past Medical History:  Diagnosis Date  . Depression   . Esophageal reflux   . Fibroids   . Glaucoma   . Herpes genitalia   . Hypercholesteremia   . Hypertension   . Thyroid disease     Past Surgical History:  Procedure Laterality Date  . COLONOSCOPY  02/2014   Dr.Elliott    Family History  Problem Relation Age of Onset  . Dementia Mother        senile, early stages  . Hypertension Mother   . Breast cancer Maternal Aunt 83    Social History   Socioeconomic History  . Marital status: Single    Spouse name: Not on file  . Number of children: Not on file  . Years of education: Not on file  . Highest education level: Not on file  Occupational History  . Not on file  Social Needs  . Financial resource strain: Not on file    . Food insecurity:    Worry: Not on file    Inability: Not on file  . Transportation needs:    Medical: Not on file    Non-medical: Not on file  Tobacco Use  . Smoking status: Never Smoker  . Smokeless tobacco: Never Used  Substance and Sexual Activity  . Alcohol use: Yes  . Drug use: No  . Sexual activity: Not Currently    Partners: Male    Birth control/protection: None  Lifestyle  . Physical activity:    Days per week: Not on file    Minutes per session: Not on file  . Stress: Not on file  Relationships  . Social connections:    Talks on phone: Not on file    Gets together: Not on file    Attends religious service: Not on file    Active member of club or organization: Not on file    Attends meetings of clubs or organizations: Not on file    Relationship status: Not on file  . Intimate partner violence:    Fear of current or ex partner: Not on file    Emotionally abused: Not on file    Physically abused: Not on file    Forced sexual activity: Not on file  Other Topics  Concern  . Not on file  Social History Narrative  . Not on file    Current Meds  Medication Sig  . ALA-QUIN 3-0.5 % CREA APPLY EVERY DAY TO RASH UNDER breast AND abdomen.  Marland Kitchen ALPRAZolam (XANAX) 0.25 MG tablet   . buPROPion (WELLBUTRIN) 100 MG tablet   . etodolac (LODINE) 400 MG tablet Take by mouth.  . losartan (COZAAR) 100 MG tablet Take by mouth.  . methocarbamol (ROBAXIN) 500 MG tablet Take by mouth.  . methylphenidate (RITALIN) 10 MG tablet Take by mouth.  . norethindrone (MICRONOR,CAMILA,ERRIN) 0.35 MG tablet Take 1 tablet (0.35 mg total) by mouth daily.  Marland Kitchen omeprazole (PRILOSEC) 20 MG capsule Take by mouth.  . propranolol ER (INDERAL LA) 60 MG 24 hr capsule   . sertraline (ZOLOFT) 100 MG tablet   . sertraline (ZOLOFT) 50 MG tablet Take by mouth.  . simvastatin (ZOCOR) 40 MG tablet   . travoprost, benzalkonium, (TRAVATAN) 0.004 % ophthalmic solution Apply to eye.  . zaleplon (SONATA) 10 MG  capsule   . [DISCONTINUED] norethindrone (MICRONOR,CAMILA,ERRIN) 0.35 MG tablet Take 1 tablet (0.35 mg total) by mouth daily.     ROS:  Review of Systems  Constitutional: Positive for fatigue. Negative for fever and unexpected weight change.  Respiratory: Negative for cough, shortness of breath and wheezing.   Cardiovascular: Negative for chest pain, palpitations and leg swelling.  Gastrointestinal: Negative for blood in stool, constipation, diarrhea, nausea and vomiting.  Endocrine: Negative for cold intolerance, heat intolerance and polyuria.  Genitourinary: Negative for dyspareunia, dysuria, flank pain, frequency, genital sores, hematuria, menstrual problem, pelvic pain, urgency, vaginal bleeding, vaginal discharge and vaginal pain.  Musculoskeletal: Negative for back pain, joint swelling and myalgias.  Skin: Negative for rash.  Neurological: Negative for dizziness, syncope, light-headedness, numbness and headaches.  Hematological: Negative for adenopathy.  Psychiatric/Behavioral: Positive for agitation and dysphoric mood. Negative for confusion, sleep disturbance and suicidal ideas. The patient is not nervous/anxious.      Objective: BP 140/90   Pulse 89   Ht 5' (1.524 m)   Wt 206 lb (93.4 kg)   BMI 40.23 kg/m    Physical Exam Constitutional:      Appearance: She is well-developed.  Genitourinary:     Uterus normal.     Vaginal bleeding present.     No vaginal discharge, erythema or tenderness.     No cervical motion tenderness or polyp.     Uterus is not enlarged or tender.     No right or left adnexal mass present.     Right adnexa not tender.     Left adnexa not tender.  Neck:     Musculoskeletal: Normal range of motion.     Thyroid: No thyromegaly.  Cardiovascular:     Rate and Rhythm: Normal rate and regular rhythm.     Heart sounds: Normal heart sounds. No murmur.  Pulmonary:     Effort: Pulmonary effort is normal.     Breath sounds: Normal breath sounds.    Chest:     Breasts:        Right: No mass, nipple discharge, skin change or tenderness.        Left: No mass, nipple discharge, skin change or tenderness.  Abdominal:     Palpations: Abdomen is soft.     Tenderness: There is no abdominal tenderness. There is no guarding.  Musculoskeletal: Normal range of motion.  Neurological:     Mental Status: She is alert and oriented to  person, place, and time.     Cranial Nerves: No cranial nerve deficit.  Psychiatric:        Behavior: Behavior normal.  Vitals signs reviewed.     Assessment/Plan: Encounter for annual routine gynecological examination  Cervical cancer screening - Plan: Cytology - PAP  Screening for HPV (human papillomavirus) - Plan: Cytology - PAP  Encounter for surveillance of contraceptive pills - POP RF. Re-eval need next yr.  - Plan: norethindrone (MICRONOR,CAMILA,ERRIN) 0.35 MG tablet           GYN counsel breast self exam, mammography screening, adequate intake of calcium and vitamin D, diet and exercise     F/U  Return in about 1 year (around 03/14/2019).  Davinci Glotfelty B. Jernee Murtaugh, PA-C 03/13/2018 2:19 PM

## 2018-03-19 LAB — CYTOLOGY - PAP: HPV (WINDOPATH): NOT DETECTED

## 2018-03-29 ENCOUNTER — Other Ambulatory Visit (HOSPITAL_COMMUNITY)
Admission: RE | Admit: 2018-03-29 | Discharge: 2018-03-29 | Disposition: A | Discharge status: Home / Self Care | Payer: 59 | Source: Ambulatory Visit | Attending: Obstetrics & Gynecology | Admitting: Obstetrics & Gynecology

## 2018-03-29 ENCOUNTER — Ambulatory Visit (INDEPENDENT_AMBULATORY_CARE_PROVIDER_SITE_OTHER): Payer: 59 | Admitting: Obstetrics & Gynecology

## 2018-03-29 ENCOUNTER — Encounter: Payer: Self-pay | Admitting: Obstetrics & Gynecology

## 2018-03-29 VITALS — BP 140/90 | Ht 60.0 in | Wt 207.0 lb

## 2018-03-29 DIAGNOSIS — N870 Mild cervical dysplasia: Secondary | ICD-10-CM

## 2018-03-29 DIAGNOSIS — R87611 Atypical squamous cells cannot exclude high grade squamous intraepithelial lesion on cytologic smear of cervix (ASC-H): Secondary | ICD-10-CM | POA: Diagnosis not present

## 2018-03-29 NOTE — Patient Instructions (Signed)
Colposcopy, Care After  This sheet gives you information about how to care for yourself after your procedure. Your health care provider may also give you more specific instructions. If you have problems or questions, contact your health care provider.  What can I expect after the procedure?  If you had a colposcopy without a biopsy, you can expect to feel fine right away, but you may have some spotting for a few days. You can go back to your normal activities.  If you had a colposcopy with a biopsy, it is common to have:   Soreness and pain. This may last for a few days.   Light-headedness.   Mild vaginal bleeding or dark-colored, grainy discharge. This may last for a few days. The discharge may be due to a solution that was used during the procedure. You may need to wear a sanitary pad during this time.   Spotting for at least 48 hours after the procedure.  Follow these instructions at home:     Take over-the-counter and prescription medicines only as told by your health care provider. Talk with your health care provider about what type of over-the-counter pain medicine and prescription medicine you can start taking again. It is especially important to talk with your health care provider if you take blood-thinning medicine.   Do not drive or use heavy machinery while taking prescription pain medicine.   For at least 3 days after your procedure, or as long as told by your health care provider, avoid:  ? Douching.  ? Using tampons.  ? Having sexual intercourse.   Continue to use birth control (contraception).   Limit your physical activity for the first day after the procedure as told by your health care provider. Ask your health care provider what activities are safe for you.   It is up to you to get the results of your procedure. Ask your health care provider, or the department performing the procedure, when your results will be ready.   Keep all follow-up visits as told by your health care provider.  This is important.  Contact a health care provider if:   You develop a skin rash.  Get help right away if:   You are bleeding heavily from your vagina or you are passing blood clots. This includes using more than one sanitary pad per hour for 2 hours in a row.   You have a fever or chills.   You have pelvic pain.   You have abnormal, yellow-colored, or bad-smelling vaginal discharge. This could be a sign of infection.   You have severe pain or cramps in your lower abdomen that do not get better with medicine.   You feel light-headed or dizzy, or you faint.  Summary   If you had a colposcopy without a biopsy, you can expect to feel fine right away, but you may have some spotting for a few days. You can go back to your normal activities.   If you had a colposcopy with a biopsy, you may notice mild pain and spotting for 48 hours after the procedure.   Avoid douching, using tampons, and having sexual intercourse for 3 days after the procedure or as long as told by your health care provider.   Contact your health care provider if you have bleeding, severe pain, or signs of infection.  This information is not intended to replace advice given to you by your health care provider. Make sure you discuss any questions you have with your   health care provider.  Document Released: 01/01/2013 Document Revised: 10/29/2015 Document Reviewed: 10/29/2015  Elsevier Interactive Patient Education  2019 [employer]

## 2018-03-29 NOTE — Progress Notes (Signed)
HPI:  Olivia Sanchez is a 53 y.o.  G0P0000  who presents today for evaluation and management of abnormal cervical cytology.    Dysplasia History:  ASC-H on recent PAP Prior PAP 2016 normal  ROS:  Pertinent items are noted in HPI.  OB History  Gravida Para Term Preterm AB Living  0 0 0 0 0 0  SAB TAB Ectopic Multiple Live Births  0 0 0 0 0    Past Medical History:  Diagnosis Date  . Depression   . Esophageal reflux   . Fibroids   . Glaucoma   . Herpes genitalia   . Hypercholesteremia   . Hypertension   . Thyroid disease     Past Surgical History:  Procedure Laterality Date  . COLONOSCOPY  02/2014   Dr.Elliott    SOCIAL HISTORY: Social History   Substance and Sexual Activity  Alcohol Use Yes   Social History   Substance and Sexual Activity  Drug Use No     Family History  Problem Relation Age of Onset  . Dementia Mother        senile, early stages  . Hypertension Mother   . Breast cancer Maternal Aunt 83    ALLERGIES:  Patient has no known allergies.  Current Outpatient Medications on File Prior to Visit  Medication Sig Dispense Refill  . ALA-QUIN 3-0.5 % CREA APPLY EVERY DAY TO RASH UNDER breast AND abdomen.  6  . ALPRAZolam (XANAX) 0.25 MG tablet   2  . buPROPion (WELLBUTRIN) 100 MG tablet   5  . etodolac (LODINE) 400 MG tablet Take by mouth.    . losartan (COZAAR) 100 MG tablet Take by mouth.    . methocarbamol (ROBAXIN) 500 MG tablet Take by mouth.    . methylphenidate (RITALIN) 10 MG tablet Take by mouth.    . norethindrone (MICRONOR,CAMILA,ERRIN) 0.35 MG tablet Take 1 tablet (0.35 mg total) by mouth daily. 84 tablet 3  . omeprazole (PRILOSEC) 20 MG capsule Take by mouth.    . propranolol ER (INDERAL LA) 60 MG 24 hr capsule   11  . sertraline (ZOLOFT) 100 MG tablet   5  . sertraline (ZOLOFT) 50 MG tablet Take by mouth.    . simvastatin (ZOCOR) 40 MG tablet   3  . travoprost, benzalkonium, (TRAVATAN) 0.004 % ophthalmic solution Apply to eye.     . zaleplon (SONATA) 10 MG capsule   5   No current facility-administered medications on file prior to visit.     Physical Exam: -Vitals:  BP 140/90   Ht 5' (1.524 m)   Wt 207 lb (93.9 kg)   BMI 40.43 kg/m  GEN: WD, WN, NAD.  A+ O x 3, good mood and affect. ABD:  NT, ND.  Soft, no masses.  No hernias noted.   Pelvic:   Vulva: Normal appearance.  No lesions.  Vagina: No lesions or abnormalities noted.  Support: Normal pelvic support.  Urethra No masses tenderness or scarring.  Meatus Normal size without lesions or prolapse.  Cervix: See below.  Anus: Normal exam.  No lesions.  Perineum: Normal exam.  No lesions.        Bimanual   Uterus: Normal size.  Non-tender.  Mobile.  AV.  Adnexae: No masses.  Non-tender to palpation.  Cul-de-sac: Negative for abnormality.  Physical Exam Genitourinary:      PROCEDURE: 1.  Urine Pregnancy Test:  not done 2.  Colposcopy performed with 4% acetic acid after verbal consent  obtained                           -Aceto-white Lesions Location(s): none.  NARROW TZ, small cervical os (nuuliparous)              -Biopsy performed at 3, 7 o'clock               -ECC indicated and performed: Yes.  Poor specimen due to narrow os     -Biopsy sites made hemostatic with pressure, AgNO3, and/or Monsel's solution   -Satisfactory colposcopy: Yes.      -Evidence of Invasive cervical CA :  NO  ASSESSMENT:  Olivia Sanchez is a 53 y.o. G0P0000 here for  1. Papanicolaou smear of cervix with atypical squamous cells cannot exclude high grade squamous intraepithelial lesion (ASC-H)    PLAN: 1.  I discussed the grading system of pap smears and HPV high risk viral types.  We will discuss and base management after colpo results return. 2. Follow up PAP 6 months, vs intervention if high grade dysplasia identified 3. Treatment of persistantly abnormal PAP smears and cervical dysplasia, even mild, is discussed w pt today in detail, as well as the pros and cons of  Cryo and LEEP procedures. Will consider and discuss after results.      Olivia Applebaum, MD, [facility], [facility] 03/29/2018  9:15 AM

## 2018-04-01 DIAGNOSIS — E7800 Pure hypercholesterolemia, unspecified: Secondary | ICD-10-CM | POA: Diagnosis not present

## 2018-04-01 DIAGNOSIS — I10 Essential (primary) hypertension: Secondary | ICD-10-CM | POA: Diagnosis not present

## 2018-04-03 NOTE — Progress Notes (Signed)
LM, awaiting call back.  PAP ASC-H, Biopsies CIN I.  Plan PAP 6 mos.

## 2018-04-22 DIAGNOSIS — F341 Dysthymic disorder: Secondary | ICD-10-CM | POA: Diagnosis not present

## 2018-06-03 DIAGNOSIS — F341 Dysthymic disorder: Secondary | ICD-10-CM | POA: Diagnosis not present

## 2018-06-24 DIAGNOSIS — H5213 Myopia, bilateral: Secondary | ICD-10-CM | POA: Diagnosis not present

## 2018-07-01 DIAGNOSIS — F341 Dysthymic disorder: Secondary | ICD-10-CM | POA: Diagnosis not present

## 2018-07-08 ENCOUNTER — Other Ambulatory Visit: Payer: Self-pay | Admitting: Internal Medicine

## 2018-07-08 ENCOUNTER — Other Ambulatory Visit: Payer: Self-pay

## 2018-07-08 ENCOUNTER — Ambulatory Visit
Admission: RE | Admit: 2018-07-08 | Discharge: 2018-07-08 | Disposition: A | Discharge status: Home / Self Care | Payer: 59 | Source: Ambulatory Visit | Attending: Internal Medicine | Admitting: Internal Medicine

## 2018-07-08 DIAGNOSIS — M25562 Pain in left knee: Secondary | ICD-10-CM | POA: Diagnosis not present

## 2018-07-08 DIAGNOSIS — I10 Essential (primary) hypertension: Secondary | ICD-10-CM | POA: Diagnosis not present

## 2018-07-08 DIAGNOSIS — R600 Localized edema: Secondary | ICD-10-CM | POA: Diagnosis not present

## 2018-07-09 DIAGNOSIS — M2392 Unspecified internal derangement of left knee: Secondary | ICD-10-CM | POA: Diagnosis not present

## 2018-07-09 DIAGNOSIS — M25562 Pain in left knee: Secondary | ICD-10-CM | POA: Diagnosis not present

## 2018-07-24 DIAGNOSIS — E7800 Pure hypercholesterolemia, unspecified: Secondary | ICD-10-CM | POA: Diagnosis not present

## 2018-07-24 DIAGNOSIS — R739 Hyperglycemia, unspecified: Secondary | ICD-10-CM | POA: Diagnosis not present

## 2018-07-24 DIAGNOSIS — I10 Essential (primary) hypertension: Secondary | ICD-10-CM | POA: Diagnosis not present

## 2018-07-24 DIAGNOSIS — K219 Gastro-esophageal reflux disease without esophagitis: Secondary | ICD-10-CM | POA: Diagnosis not present

## 2018-07-24 DIAGNOSIS — Z79899 Other long term (current) drug therapy: Secondary | ICD-10-CM | POA: Diagnosis not present

## 2018-07-29 DIAGNOSIS — F341 Dysthymic disorder: Secondary | ICD-10-CM | POA: Diagnosis not present

## 2018-08-26 DIAGNOSIS — F341 Dysthymic disorder: Secondary | ICD-10-CM | POA: Diagnosis not present

## 2018-08-28 DIAGNOSIS — M25862 Other specified joint disorders, left knee: Secondary | ICD-10-CM | POA: Diagnosis not present

## 2018-09-30 ENCOUNTER — Other Ambulatory Visit: Payer: Self-pay

## 2018-09-30 ENCOUNTER — Encounter: Payer: Self-pay | Admitting: Obstetrics & Gynecology

## 2018-09-30 ENCOUNTER — Ambulatory Visit (INDEPENDENT_AMBULATORY_CARE_PROVIDER_SITE_OTHER): Payer: 59 | Admitting: Obstetrics & Gynecology

## 2018-09-30 ENCOUNTER — Other Ambulatory Visit (HOSPITAL_COMMUNITY)
Admission: RE | Admit: 2018-09-30 | Discharge: 2018-09-30 | Disposition: A | Discharge status: Home / Self Care | Payer: 59 | Source: Ambulatory Visit | Attending: Obstetrics & Gynecology | Admitting: Obstetrics & Gynecology

## 2018-09-30 VITALS — BP 128/88 | Ht 60.0 in | Wt 200.0 lb

## 2018-09-30 DIAGNOSIS — N870 Mild cervical dysplasia: Secondary | ICD-10-CM | POA: Diagnosis not present

## 2018-09-30 DIAGNOSIS — F341 Dysthymic disorder: Secondary | ICD-10-CM | POA: Diagnosis not present

## 2018-09-30 NOTE — Progress Notes (Signed)
  HPI:  Patient is a 53 y.o. G0P0000 presenting for follow up evaluation of abnormal PAP smear in the past.  Her last PAP was 7 months ago and approximate date 02/2018 and was abnormal: ASC-H. She has had a prior colposcopy. Prior biopsies (if done) were CIN I all three biopsies (incl ECC).  PMHx: She  has a past medical history of Depression, Esophageal reflux, Fibroids, Glaucoma, Herpes genitalia, Hypercholesteremia, Hypertension, and Thyroid disease. Also,  has a past surgical history that includes Colonoscopy (02/2014)., family history includes Breast cancer (age of onset: 34) in her maternal aunt; Dementia in her mother; Hypertension in her mother.,  reports that she has never smoked. She has never used smokeless tobacco. She reports current alcohol use. She reports that she does not use drugs.  She has a current medication list which includes the following prescription(s): ala-quin, alprazolam, bupropion, etodolac, losartan, methocarbamol, methylphenidate, norethindrone, omeprazole, propranolol er, sertraline, sertraline, simvastatin, travoprost (benzalkonium), and zaleplon. Also, has No Known Allergies.  Review of Systems  All other systems reviewed and are negative.   Objective: BP 128/88   Ht 5' (1.524 m)   Wt 200 lb (90.7 kg)   BMI 39.06 kg/m  Filed Weights   09/30/18 0809  Weight: 200 lb (90.7 kg)   Body mass index is 39.06 kg/m.  Physical examination Physical Exam Constitutional:      General: She is not in acute distress.    Appearance: She is well-developed.  Genitourinary:     Pelvic exam was performed with patient supine.     Vagina and uterus normal.     No vaginal erythema or bleeding.     No cervical motion tenderness, discharge, polyp or nabothian cyst.     Uterus is mobile.     Uterus is not enlarged.     No uterine mass detected.    Uterus is midaxial.     No right or left adnexal mass present.     Right adnexa not tender.     Left adnexa not tender.   HENT:     Head: Normocephalic and atraumatic.     Nose: Nose normal.  Abdominal:     General: There is no distension.     Palpations: Abdomen is soft.     Tenderness: There is no abdominal tenderness.  Musculoskeletal: Normal range of motion.  Neurological:     Mental Status: She is alert and oriented to person, place, and time.     Cranial Nerves: No cranial nerve deficit.  Skin:    General: Skin is warm and dry.    ASSESSMENT:  History of Cervical Dysplasia, 1. CIN I (cervical intraepithelial neoplasia I) - Cytology - PAP  Plan: 1.  I discussed the grading system of pap smears and HPV high risk viral types.   2. Follow up PAP 6 months, vs intervention if high grade dysplasia identified. 3. Treatment of persistantly abnormal PAP smears and cervical dysplasia, even mild, is discussed w pt today in detail, as well as the pros and cons of Cryo and LEEP procedures. Will consider and discuss after results.   Barnett Applebaum, MD, [facility], [facility] 09/30/2018  8:11 AM

## 2018-10-04 LAB — CYTOLOGY - PAP
Adequacy: ABSENT
Diagnosis: NEGATIVE

## 2018-10-24 DIAGNOSIS — L820 Inflamed seborrheic keratosis: Secondary | ICD-10-CM | POA: Diagnosis not present

## 2018-10-24 DIAGNOSIS — L603 Nail dystrophy: Secondary | ICD-10-CM | POA: Diagnosis not present

## 2018-10-24 DIAGNOSIS — D225 Melanocytic nevi of trunk: Secondary | ICD-10-CM | POA: Diagnosis not present

## 2018-10-24 DIAGNOSIS — Z1283 Encounter for screening for malignant neoplasm of skin: Secondary | ICD-10-CM | POA: Diagnosis not present

## 2018-10-24 DIAGNOSIS — L821 Other seborrheic keratosis: Secondary | ICD-10-CM | POA: Diagnosis not present

## 2018-10-24 DIAGNOSIS — L813 Cafe au lait spots: Secondary | ICD-10-CM | POA: Diagnosis not present

## 2018-10-24 DIAGNOSIS — L304 Erythema intertrigo: Secondary | ICD-10-CM | POA: Diagnosis not present

## 2018-10-24 DIAGNOSIS — D224 Melanocytic nevi of scalp and neck: Secondary | ICD-10-CM | POA: Diagnosis not present

## 2018-10-24 DIAGNOSIS — Z872 Personal history of diseases of the skin and subcutaneous tissue: Secondary | ICD-10-CM | POA: Diagnosis not present

## 2018-10-28 DIAGNOSIS — F341 Dysthymic disorder: Secondary | ICD-10-CM | POA: Diagnosis not present

## 2018-11-26 DIAGNOSIS — H40003 Preglaucoma, unspecified, bilateral: Secondary | ICD-10-CM | POA: Diagnosis not present

## 2018-11-26 DIAGNOSIS — H40053 Ocular hypertension, bilateral: Secondary | ICD-10-CM | POA: Diagnosis not present

## 2018-12-02 DIAGNOSIS — F341 Dysthymic disorder: Secondary | ICD-10-CM | POA: Diagnosis not present

## 2018-12-31 ENCOUNTER — Other Ambulatory Visit: Payer: Self-pay | Admitting: Internal Medicine

## 2018-12-31 DIAGNOSIS — Z1231 Encounter for screening mammogram for malignant neoplasm of breast: Secondary | ICD-10-CM

## 2019-01-09 DIAGNOSIS — F341 Dysthymic disorder: Secondary | ICD-10-CM | POA: Diagnosis not present

## 2019-01-23 ENCOUNTER — Ambulatory Visit
Admission: RE | Admit: 2019-01-23 | Discharge: 2019-01-23 | Disposition: A | Discharge status: Home / Self Care | Payer: 59 | Source: Ambulatory Visit | Attending: Internal Medicine | Admitting: Internal Medicine

## 2019-01-23 ENCOUNTER — Other Ambulatory Visit: Payer: Self-pay

## 2019-01-23 DIAGNOSIS — Z1231 Encounter for screening mammogram for malignant neoplasm of breast: Secondary | ICD-10-CM | POA: Diagnosis not present

## 2019-01-24 DIAGNOSIS — I10 Essential (primary) hypertension: Secondary | ICD-10-CM | POA: Diagnosis not present

## 2019-01-24 DIAGNOSIS — R739 Hyperglycemia, unspecified: Secondary | ICD-10-CM | POA: Diagnosis not present

## 2019-01-24 DIAGNOSIS — Z79899 Other long term (current) drug therapy: Secondary | ICD-10-CM | POA: Diagnosis not present

## 2019-01-24 DIAGNOSIS — E7800 Pure hypercholesterolemia, unspecified: Secondary | ICD-10-CM | POA: Diagnosis not present

## 2019-01-24 DIAGNOSIS — Z0000 Encounter for general adult medical examination without abnormal findings: Secondary | ICD-10-CM | POA: Diagnosis not present

## 2019-02-03 DIAGNOSIS — F341 Dysthymic disorder: Secondary | ICD-10-CM | POA: Diagnosis not present

## 2019-02-10 ENCOUNTER — Telehealth: Payer: Self-pay | Admitting: Obstetrics & Gynecology

## 2019-02-10 ENCOUNTER — Other Ambulatory Visit: Payer: Self-pay

## 2019-02-10 DIAGNOSIS — Z3041 Encounter for surveillance of contraceptive pills: Secondary | ICD-10-CM

## 2019-02-10 MED ORDER — NORETHINDRONE 0.35 MG PO TABS: 1.0000 | ORAL_TABLET | Freq: Every day | ORAL | 3 refills | Status: DC

## 2019-02-10 NOTE — Telephone Encounter (Signed)
Patient needs refill on bc, annual scheduled 04/02/2019 with RPH.  [facility] pharmacy

## 2019-02-10 NOTE — Telephone Encounter (Signed)
Left voice message to advise pt refills been sent in

## 2019-02-10 NOTE — Telephone Encounter (Signed)
Done

## 2019-02-27 ENCOUNTER — Other Ambulatory Visit: Payer: Self-pay | Admitting: General Surgery

## 2019-02-27 DIAGNOSIS — M6740 Ganglion, unspecified site: Secondary | ICD-10-CM | POA: Diagnosis not present

## 2019-03-03 DIAGNOSIS — F341 Dysthymic disorder: Secondary | ICD-10-CM | POA: Diagnosis not present

## 2019-03-10 DIAGNOSIS — M25862 Other specified joint disorders, left knee: Secondary | ICD-10-CM | POA: Diagnosis not present

## 2019-03-10 DIAGNOSIS — M25562 Pain in left knee: Secondary | ICD-10-CM | POA: Diagnosis not present

## 2019-03-12 ENCOUNTER — Ambulatory Visit
Admission: RE | Admit: 2019-03-12 | Discharge: 2019-03-12 | Disposition: A | Discharge status: Home / Self Care | Payer: 59 | Source: Ambulatory Visit | Attending: General Surgery | Admitting: General Surgery

## 2019-03-12 ENCOUNTER — Other Ambulatory Visit: Payer: Self-pay

## 2019-03-12 DIAGNOSIS — M25562 Pain in left knee: Secondary | ICD-10-CM | POA: Diagnosis not present

## 2019-03-12 DIAGNOSIS — M6740 Ganglion, unspecified site: Secondary | ICD-10-CM | POA: Diagnosis not present

## 2019-04-01 DIAGNOSIS — I10 Essential (primary) hypertension: Secondary | ICD-10-CM | POA: Diagnosis not present

## 2019-04-01 DIAGNOSIS — E7800 Pure hypercholesterolemia, unspecified: Secondary | ICD-10-CM | POA: Diagnosis not present

## 2019-04-02 ENCOUNTER — Encounter: Payer: Self-pay | Admitting: Obstetrics & Gynecology

## 2019-04-02 ENCOUNTER — Other Ambulatory Visit: Payer: Self-pay

## 2019-04-02 ENCOUNTER — Ambulatory Visit (INDEPENDENT_AMBULATORY_CARE_PROVIDER_SITE_OTHER): Payer: 59 | Admitting: Obstetrics & Gynecology

## 2019-04-02 ENCOUNTER — Other Ambulatory Visit (HOSPITAL_COMMUNITY)
Admission: RE | Admit: 2019-04-02 | Discharge: 2019-04-02 | Disposition: A | Discharge status: Home / Self Care | Payer: 59 | Source: Ambulatory Visit | Attending: Obstetrics & Gynecology | Admitting: Obstetrics & Gynecology

## 2019-04-02 VITALS — BP 138/82 | Ht 61.0 in | Wt 202.0 lb

## 2019-04-02 DIAGNOSIS — M67462 Ganglion, left knee: Secondary | ICD-10-CM | POA: Diagnosis not present

## 2019-04-02 DIAGNOSIS — N870 Mild cervical dysplasia: Secondary | ICD-10-CM | POA: Diagnosis not present

## 2019-04-02 DIAGNOSIS — Z3041 Encounter for surveillance of contraceptive pills: Secondary | ICD-10-CM

## 2019-04-02 DIAGNOSIS — Z1211 Encounter for screening for malignant neoplasm of colon: Secondary | ICD-10-CM

## 2019-04-02 DIAGNOSIS — Z01419 Encounter for gynecological examination (general) (routine) without abnormal findings: Secondary | ICD-10-CM

## 2019-04-02 MED ORDER — NORETHINDRONE 0.35 MG PO TABS: 1.0000 | ORAL_TABLET | Freq: Every day | ORAL | 3 refills | Status: DC

## 2019-04-02 NOTE — Patient Instructions (Signed)
PAP every 6 months Mammogram every year    Due Oct 2021 Colonoscopy every 10 years Labs yearly (with PCP)

## 2019-04-02 NOTE — Progress Notes (Signed)
HPI:      [name] is a 54 y.o. G0P0000 who LMP was No LMP recorded. Patient is perimenopausal., she presents today for her annual examination. The patient has no complaints today. The patient is sexually active. Her last pap: approximate date 09/2018 and was normal and 03/2018 had colpo w CIN I; after ASC-H PAP; and last mammogram: approximate date 12/2018 and was normal. The patient does perform self breast exams.  There is no notable family history of breast or ovarian cancer in her family.  The patient has regular exercise: yes.  The patient denies current symptoms of depression.    GYN History: Contraception: oral progesterone-only contraceptive for bleeding related to fibroids, controls bleeding well w rare spotting episodes  PMHx: Past Medical History:  Diagnosis Date  . Depression   . Esophageal reflux   . Fibroids   . Glaucoma   . Herpes genitalia   . Hypercholesteremia   . Hypertension   . Thyroid disease    Past Surgical History:  Procedure Laterality Date  . COLONOSCOPY  02/2014   Dr.Elliott   Family History  Problem Relation Age of Onset  . Dementia Mother        senile, early stages  . Hypertension Mother   . Breast cancer Maternal Aunt 83   Social History   Tobacco Use  . Smoking status: Never Smoker  . Smokeless tobacco: Never Used  Substance Use Topics  . Alcohol use: Yes  . Drug use: No    Current Outpatient Medications:  .  ALA-QUIN 3-0.5 % CREA, APPLY EVERY DAY TO RASH UNDER breast AND abdomen., Disp: , Rfl: 6 .  ALPRAZolam (XANAX) 0.25 MG tablet, , Disp: , Rfl: 2 .  buPROPion (WELLBUTRIN) 100 MG tablet, , Disp: , Rfl: 5 .  etodolac (LODINE) 400 MG tablet, Take by mouth., Disp: , Rfl:  .  losartan (COZAAR) 100 MG tablet, Take by mouth., Disp: , Rfl:  .  methocarbamol (ROBAXIN) 500 MG tablet, Take by mouth., Disp: , Rfl:  .  methylphenidate (RITALIN) 10 MG tablet, Take by mouth., Disp: , Rfl:  .  norethindrone (MICRONOR) 0.35 MG tablet,  Take 1 tablet (0.35 mg total) by mouth daily., Disp: 84 tablet, Rfl: 3 .  omeprazole (PRILOSEC) 20 MG capsule, Take by mouth., Disp: , Rfl:  .  propranolol ER (INDERAL LA) 60 MG 24 hr capsule, , Disp: , Rfl: 11 .  sertraline (ZOLOFT) 100 MG tablet, , Disp: , Rfl: 5 .  sertraline (ZOLOFT) 50 MG tablet, Take by mouth., Disp: , Rfl:  .  simvastatin (ZOCOR) 40 MG tablet, , Disp: , Rfl: 3 .  travoprost, benzalkonium, (TRAVATAN) 0.004 % ophthalmic solution, Apply to eye., Disp: , Rfl:  .  zaleplon (SONATA) 10 MG capsule, , Disp: , Rfl: 5 Allergies: Patient has no known allergies.  Review of Systems  Constitutional: Negative for chills, fever and malaise/fatigue.  HENT: Negative for congestion, sinus pain and sore throat.   Eyes: Negative for blurred vision and pain.  Respiratory: Negative for cough and wheezing.   Cardiovascular: Negative for chest pain and leg swelling.  Gastrointestinal: Negative for abdominal pain, constipation, diarrhea, heartburn, nausea and vomiting.  Genitourinary: Negative for dysuria, frequency, hematuria and urgency.  Musculoskeletal: Negative for back pain, joint pain, myalgias and neck pain.  Skin: Negative for itching and rash.  Neurological: Negative for dizziness, tremors and weakness.  Endo/Heme/Allergies: Does not bruise/bleed easily.  Psychiatric/Behavioral: Negative for depression. The patient is not  nervous/anxious and does not have insomnia.     Objective: BP 138/82   Ht 5\' 1"  (1.549 m)   Wt 202 lb (91.6 kg)   BMI 38.17 kg/m   Filed Weights   04/02/19 0819  Weight: 202 lb (91.6 kg)   Body mass index is 38.17 kg/m. Physical Exam Constitutional:      General: She is not in acute distress.    Appearance: She is well-developed. She is obese.  Genitourinary:     Pelvic exam was performed with patient supine.     Urethra, bladder, vagina and rectum normal.     No lesions in the vagina.     No vaginal bleeding.     No cervical motion tenderness,  friability, lesion or polyp.     Uterus is enlarged and mobile.     No uterine mass detected.    Uterus is midaxial and globular.     No right or left adnexal mass present.     Right adnexa not tender.     Left adnexa not tender.     Genitourinary Comments: 8 week size  HENT:     Head: Normocephalic and atraumatic. No laceration.     Right Ear: Hearing normal.     Left Ear: Hearing normal.     Mouth/Throat:     Pharynx: Uvula midline.  Eyes:     Pupils: Pupils are equal, round, and reactive to light.  Neck:     Thyroid: No thyromegaly.  Cardiovascular:     Rate and Rhythm: Normal rate and regular rhythm.     Heart sounds: No murmur. No friction rub. No gallop.   Pulmonary:     Effort: Pulmonary effort is normal. No respiratory distress.     Breath sounds: Normal breath sounds. No wheezing.  Chest:     Breasts:        Right: No mass, skin change or tenderness.        Left: No mass, skin change or tenderness.  Abdominal:     General: Bowel sounds are normal. There is no distension.     Palpations: Abdomen is soft.     Tenderness: There is no abdominal tenderness. There is no rebound.  Musculoskeletal:        General: Normal range of motion.     Cervical back: Normal range of motion and neck supple.  Neurological:     Mental Status: She is alert and oriented to person, place, and time.     Cranial Nerves: No cranial nerve deficit.  Skin:    General: Skin is warm and dry.  Psychiatric:        Judgment: Judgment normal.  Vitals reviewed.     Assessment:  ANNUAL EXAM 1. Women's annual routine gynecological examination   2. CIN I (cervical intraepithelial neoplasia I)   3. Screen for colon cancer   4. Encounter for surveillance of contraceptive pills               1.  Cervical Screening-  Pap smear done today due to CIN I (cervical intraepithelial neoplasia I) - Cytology - PAP  2. Breast screening- Exam annually and mammogram>40 planned   3. Colonoscopy every 10  years, Hemoccult testing - after age 83  4. Labs managed by PCP   5. Counseling for contraception: oral progesterone-only contraceptive as this helps with bleeding from fibroids - norethindrone (MICRONOR) 0.35 MG tablet; Take 1 tablet (0.35 mg total) by mouth daily.  Dispense: 84 tablet;  Refill: 3  6. Fibroids; sx's to monitor for discussed [place] if sx's worsen    F/U  Return in about 6 months (around 09/30/2019) for Follow up PAP.  Barnett Applebaum, MD, [facility], [facility] 04/02/2019  8:52 AM

## 2019-04-03 LAB — CYTOLOGY - PAP
Adequacy: ABSENT
Diagnosis: NEGATIVE

## 2019-04-06 DIAGNOSIS — M67462 Ganglion, left knee: Secondary | ICD-10-CM | POA: Insufficient documentation

## 2019-04-07 DIAGNOSIS — F341 Dysthymic disorder: Secondary | ICD-10-CM | POA: Diagnosis not present

## 2019-04-15 ENCOUNTER — Other Ambulatory Visit (HOSPITAL_COMMUNITY): Payer: Self-pay | Admitting: Neurology

## 2019-04-15 ENCOUNTER — Other Ambulatory Visit: Payer: Self-pay | Admitting: Neurology

## 2019-04-15 DIAGNOSIS — E538 Deficiency of other specified B group vitamins: Secondary | ICD-10-CM | POA: Diagnosis not present

## 2019-04-15 DIAGNOSIS — R519 Headache, unspecified: Secondary | ICD-10-CM | POA: Diagnosis not present

## 2019-04-15 DIAGNOSIS — M542 Cervicalgia: Secondary | ICD-10-CM | POA: Diagnosis not present

## 2019-04-15 DIAGNOSIS — G5603 Carpal tunnel syndrome, bilateral upper limbs: Secondary | ICD-10-CM | POA: Diagnosis not present

## 2019-04-15 DIAGNOSIS — E559 Vitamin D deficiency, unspecified: Secondary | ICD-10-CM | POA: Diagnosis not present

## 2019-04-15 DIAGNOSIS — R0683 Snoring: Secondary | ICD-10-CM | POA: Diagnosis not present

## 2019-04-15 NOTE — H&P (Signed)
ORTHOPAEDIC HISTORY & PHYSICAL  Progress Notes by Lamar Benes., MD at 04/02/2019 10:30 AM  Chief Complaint:     Chief Complaint  Patient presents with  . Follow-up    Left knee cyst    Reason for Visit: The patient is a 54 y.o. female who presents today for reevaluation of her left knee. She reports a several month history of a "knot" to the antero-medial aspect of the left knee.  She noted the onset of the swelling after she felt a "pop" across the front of her knee when she was getting into her jeep.  The lesion has somewhat increased in size.  She has some mild discomfort with deep palpation of the site.  She denies any generalized swelling to the knee.  She denies any locking, popping, or giving way of the knee. The patient has not appreciated any significant improvement despite Tylenol, ice, and activity modification. She is not using any ambulatory aids.  Medications: Current Medications        Current Outpatient Medications  Medication Sig Dispense Refill  . acetaminophen (TYLENOL ARTHRITIS ORAL) Take 2 tablets by mouth once daily       . ALPRAZolam (XANAX) 0.25 MG tablet Take 1 tablet (0.25 mg total) by mouth every 6 (six) hours as needed for Anxiety. (Patient taking differently: Take 0.25 mg by mouth nightly.  ) 60 tablet 5  . ASPIRIN/SALICYLAMIDE/CAFFEINE (BC HEADACHE POWDER ORAL) Take by mouth once daily as needed.    Marland Kitchen buPROPion (WELLBUTRIN SR) 150 MG SR tablet Take two pills in the morning, one at night.       . etodolac (LODINE) 400 MG tablet TAKE 1 TABLET BY MOUTH 2 TIMES DAILY 180 tablet 3  . latanoprost (XALATAN) 0.005 % ophthalmic solution Place 1 drop into both eyes nightly       . methocarbamoL (ROBAXIN) 500 MG tablet TAKE 1 TABLET BY MOUTH 2 TIMES DAILY 180 tablet 3  . methylphenidate HCl (RITALIN) 10 MG tablet Take 10 mg by mouth 2 (two) times daily    . NUVAIL 16 % NFSo APPLY TO AFFECTED TOENAILS EVERY DAY    . omeprazole (PRILOSEC) 20  MG DR capsule Take 1 capsule (20 mg total) by mouth 2 (two) times daily 180 capsule 3  . propranoloL (INDERAL LA) 60 MG LA capsule Take 1 capsule (60 mg total) by mouth once daily 30 capsule 11  . sertraline (ZOLOFT) 100 MG tablet Take 100 mg by mouth once daily       . simvastatin (ZOCOR) 40 MG tablet Take 1 tablet (40 mg total) by mouth once daily 90 tablet 3  . travoprost (TRAVATAN Z) 0.004 % Ophth ophthalmic solution Apply to eye    . travoprost, benzalkonium, (TRAVATAN) 0.004 % ophthalmic solution Place 1 drop into both eyes nightly.    . valsartan (DIOVAN) 160 MG tablet Take 1 tablet (160 mg total) by mouth once daily 90 tablet 3  . VYTONE 1.9-1 % CrPk APPLY TOPICALLY TO THE AFFECTED AREA UNDER breasth AND lower adomen    . zaleplon (SONATA) 10 MG capsule Take 10 mg by mouth nightly        No current facility-administered medications for this visit.       Allergies: No Known Allergies  Past Medical History:     Past Medical History:  Diagnosis Date  . Anxiety associated with depression   . Arthritis   . Benign hypertension   . Chickenpox   . Depression   .  Fibroids    uterine  . GERD (gastroesophageal reflux disease)   . Glaucoma (increased eye pressure)   . History of iron deficiency   . Hyperglycemia   . Hyperlipidemia   . Hypertension   . Hypothyroidism   . Lumbago   . Obesity   . Osteoarthritis     Past Surgical History:      Past Surgical History:  Procedure Laterality Date  . COLONOSCOPY  03/16/2014   Int Hemorrhoids: CBF 02/2024  . EGD  03/16/2014   No repeat per RTE (dw)    Social History: Social History  Social History        Socioeconomic History  . Marital status: Single    Spouse name: Not on file  . Number of children: 0  . Years of education: 28  . Highest education level: Bachelor's degree (e.g., BA, AB, BS)  Occupational History  . Occupation: Therapist, sports at [employer]  . Financial  resource strain: Not on file  . Food insecurity    Worry: Not on file    Inability: Not on file  . Transportation needs    Medical: Not on file    Non-medical: Not on file  Tobacco Use  . Smoking status: Never Smoker  . Smokeless tobacco: Never Used  Substance and Sexual Activity  . Alcohol use: Yes    Comment: Occasionally  . Drug use: No  . Sexual activity: Yes    Partners: Male    Birth control/protection: OCP  Lifestyle  . Physical activity    Days per week: Not on file    Minutes per session: Not on file  . Stress: Not on file  Relationships  . Social Herbalist on phone: Not on file    Gets together: Not on file    Attends religious service: Not on file    Active member of club or organization: Not on file    Attends meetings of clubs or organizations: Not on file    Relationship status: Not on file  Other Topics Concern  . Not on file  Social History Narrative  . Not on file      Family History: Family History  Problem Relation Age of Onset  . Heart murmur Mother   . High blood pressure (Hypertension) Mother   . Hyperlipidemia (Elevated cholesterol) Mother   . No Known Problems Father   . No Known Problems Brother     Review of Systems: A comprehensive 14 point ROS was performed, reviewed, and the pertinent orthopaedic findings are documented in the HPI.  Exam BP (!) 150/90   Temp 36.5 C (97.7 F) (Skin)   Ht 154.9 cm (5\' 1" )   Wt 90.8 kg (200 lb 3.2 oz)   BMI 37.83 kg/m   General:  Well-developed, well-nourished female seen in no acute distress.  Even heel to toe gait. No varus or valgus thrust to the left knee.  HEENT:  Atraumatic, normocephalic.  Pupils are equal and reactive to light.  Extraocular motion is intact. Sclera are clear.  Oropharynx is clear with moist mucosa.  Neck:  Supple, nontender, and with good ROM. No thyromegaly, adenopathy, JVD, or carotid bruits.  Lungs:  Clear  to auscultation bilaterally.  Cardiovascular:  Regular rate and rhythm.  Normal S1, S2.  No murmur .  No appreciable gallops or rubs. Peripheral pulses are palpable.  No lower extremity edema.  Homan`s test is negative.  Abdomen:  Soft, nontender, nondistended.  Bowel sounds are present.  Extremities: Good strength, stability, and range of motion of the upper extremities. Good range of motion of the hips and ankles.  Left  Knee:    Soft tissue swelling: There is a 2 to 3 mm slightly fluctuant, nonpulsatile mass to the anteromedial aspect of the knee    Effusion:                   none    Erythema:                 none    Crepitance:               none    Tenderness:             Mild tenderness to palpation of the cystic lesion with deep palpation    Alignment:                normal    Mediolateral laxity:   stable    Posterior sag:           negative    Patellar tracking:      Good tracking without evidence of subluxation or tilt    Atrophy:                    No significant atrophy.                                       Quadriceps tone was fair to good.    Range of motion:     Greater than 120 degrees  Neurologic:  Awake, alert, and oriented.  Sensory function is intact to pinprick and light touch.   Motor strength is judged to be 5/5.   Motor coordination is within normal limits.   No apparent clonus. No tremor.    X-rays: I reviewed the left knee radiographs that were performed at [facility] on 03/10/2019. Good preservation of the cartilage space. No chondrocalcinosis is appreciated. No significant osteophyte formation or subchondral sclerosis. No evidence of fracture or dislocation.    MRI: I reviewed the left knee MRI performed at [facility] on 03/12/2019.  I concur with the radiologist's interpretation as below:  MRI OF THE LEFT KNEE WITHOUT CONTRAST  TECHNIQUE: Multiplanar, multisequence MR imaging of the knee was performed.  No intravenous contrast was administered.  COMPARISON: None.  FINDINGS: MENISCI  Medial meniscus: Intact.  Lateral meniscus: Intact.  LIGAMENTS  Cruciates: Intact.  Collaterals: Intact.  CARTILAGE  Patellofemoral: A small focal fissure is seen in hyaline cartilage of the lateral patellar facet in the lower pole. Cartilage thinning along the inferior aspect of the medial femoral trochlea is also identified.  Medial: Preserved.  Lateral: Preserved  Joint: Trace amount of joint fluid. There is edema in Hoffa's fat off the inferior pole of the lateral patellar facet.  Popliteal Fossa: No Baker's cyst.  Extensor Mechanism: Intact.  Bones: No fracture or worrisome lesion. Tiny subchondral cysts in the inferior aspect of the medial femoral trochlea noted.  Other: There is a septated cystic lesion extending from the level of the medial metaphysis of the femur into the subcutaneous tissues. Discrete measurement is not possible but the lesion is approximately 4.7 cm transverse by 2 cm AP by up to 3.3 cm craniocaudal. The lesion is just distal to the vastus medialis muscle belly and does not  appear to communicate the joint.  IMPRESSION: Large cystic lesion along the medial aspect of the knee extending from the metaphysis of the femur into the subcutaneous tissues is likely a ganglion cyst. It does not appear to communicate the joint.  Negative for meniscal or ligament tear.  Mild patellofemoral osteoarthritis.  Electronically Signed  By: Inge Rise M.D.  On: 03/12/2019 12:52   Impression: Left knee cyst Family history of malignant hyperthermia  Plan:   The findings were discussed in detail with the patient.  Conservative treatment options were reviewed with the patient.  We discussed the risks and benefits of surgical intervention.  The usual perioperative course was also discussed in detail.  The patient expressed understanding of the risks  and benefits of surgical intervention and would like to proceed with plans for excision of the left knee cyst.  Of note, the patient does have a family history of malignant hyperthermia.  This information will be shared with Anesthesiology.  MEDICAL CLEARANCE: Per anesthesiology. ACTIVITY: As tolerated. WORK STATUS: Anticipate out of work for 1 to 2 weeks THERAPY: Preoperative physical therapy evaluation. MEDICATIONS: Requested Prescriptions    No prescriptions requested or ordered in this encounter   FOLLOW-UP: Return for preop History & Physical pending surgery date.  Jaylun Fleener P. Holley Bouche., M.D.  This note was generated in part with voice recognition software and I apologize for any typographical errors that were not detected and corrected.     Electronically signed by Lamar Benes., MD on 04/06/2019 5:38 PM

## 2019-04-16 ENCOUNTER — Encounter
Admission: RE | Admit: 2019-04-16 | Discharge: 2019-04-16 | Disposition: A | Discharge status: Home / Self Care | Payer: 59 | Source: Ambulatory Visit | Attending: Orthopedic Surgery | Admitting: Orthopedic Surgery

## 2019-04-16 NOTE — Pre-Procedure Instructions (Signed)
ECG 12-lead1/07/2019 [place] Component Name Value Ref Range  Vent Rate (bpm) 81   PR Interval (msec) 164   QRS Interval (msec) 94   QT Interval (msec) 378   QTc (msec) 439   Other Result Information  This result has an attachment that is not available.  Result Narrative  Normal sinus rhythm Possible Left atrial enlargement Borderline ECG When compared with ECG of 22-Feb-2017 11:12, No significant change was found I reviewed and concur with this report. Electronically signed [id number] MD, Darnell Level [phone]) on 04/09/2019 1:46:11 PM  Status Results Details

## 2019-04-17 ENCOUNTER — Other Ambulatory Visit: Payer: Self-pay

## 2019-04-17 ENCOUNTER — Other Ambulatory Visit
Admission: RE | Admit: 2019-04-17 | Discharge: 2019-04-17 | Disposition: A | Discharge status: Home / Self Care | Payer: 59 | Source: Ambulatory Visit | Attending: Orthopedic Surgery | Admitting: Orthopedic Surgery

## 2019-04-17 DIAGNOSIS — Z01812 Encounter for preprocedural laboratory examination: Secondary | ICD-10-CM | POA: Insufficient documentation

## 2019-04-17 DIAGNOSIS — Z20822 Contact with and (suspected) exposure to covid-19: Secondary | ICD-10-CM | POA: Diagnosis not present

## 2019-04-17 HISTORY — DX: Unspecified osteoarthritis, unspecified site: M19.90

## 2019-04-17 HISTORY — DX: Family history of other specified conditions: Z84.89

## 2019-04-17 LAB — SARS CORONAVIRUS 2 (TAT 6-24 HRS): SARS Coronavirus 2: NEGATIVE

## 2019-04-18 ENCOUNTER — Encounter
Admission: RE | Admit: 2019-04-18 | Discharge: 2019-04-18 | Disposition: A | Discharge status: Home / Self Care | Payer: 59 | Source: Ambulatory Visit | Attending: Orthopedic Surgery | Admitting: Orthopedic Surgery

## 2019-04-18 HISTORY — DX: Malignant hyperthermia due to anesthesia, initial encounter: T88.3XXA

## 2019-04-18 NOTE — Patient Instructions (Addendum)
Your procedure is scheduled on: 04-21-19 MONDAY Report to Same Day Surgery 2nd floor medical mall [facility] Entrance-take elevator on left to 2nd floor.  Check in with surgery information desk.) To find out your arrival time please call [phone] between 1PM - 3PM on 04-18-19 FRIDAY  Remember: Instructions that are not followed completely may result in serious medical risk, up to and including death, or upon the discretion of your surgeon and anesthesiologist your surgery may need to be rescheduled.    _x___ 1. Do not eat food after midnight the night before your procedure. NO GUM OR CANDY AFTER MIDNIGHT. You may drink clear liquids up to 2 hours before you are scheduled to arrive at the hospital for your procedure.  Do not drink clear liquids within 2 hours of your scheduled arrival to the hospital.  Clear liquids include  --Water or Apple juice without pulp  - Gatorade  --Black Coffee or Clear Tea (No milk, no creamers, do not add anything to the coffee or Tea   ____Ensure clear carbohydrate drink on the way to the hospital for bariatric patients  ____Ensure clear carbohydrate drink 3 hours before surgery.    __x__ 2. No Alcohol for 24 hours before or after surgery.   __x__3. No Smoking or e-cigarettes for 24 prior to surgery.  Do not use any chewable tobacco products for at least 6 hour prior to surgery   ____  4. Bring all medications with you on the day of surgery if instructed.    __x__ 5. Notify your doctor if there is any change in your medical condition     (cold, fever, infections).    x___6. On the morning of surgery brush your teeth with toothpaste and water.  You may rinse your mouth with mouth wash if you wish.  Do not swallow any toothpaste or mouthwash.   Do not wear jewelry, make-up, hairpins, clips or nail polish.  Do not wear lotions, powders, or perfumes.   Do not shave 48 hours prior to surgery. Men may shave face and neck.  Do not bring valuables to the  hospital.    [facility] is not responsible for any belongings or valuables.               Contacts, dentures or bridgework may not be worn into surgery.  Leave your suitcase in the car. After surgery it may be brought to your room.  For patients admitted to the hospital, discharge time is determined by your treatment team.  _  Patients discharged the day of surgery will not be allowed to drive home.  You will need someone to drive you home and stay with you the night of your procedure.    Please read over the following fact sheets that you were given:   [facility] Preparing for Surgery   _x___ TAKE THE FOLLOWING MEDICATION THE MORNING OF SURGERY WITH A SMALL SIP OF WATER. These include:  1. TOPAMAX  2. WELLBUTRIN  3. PROPRANOLOL  4. ZOLOFT  5. ZOCOR   6. ROBAXIN  7.PRILOSEC   8.TAKE AN EXTRA PRILOSEC THE NIGHT BEFORE YOUR SURGERY  9.YOU MAY TAKE XANAX AM OF SURGERY IF NEEDED  ____Fleets enema or Magnesium Citrate as directed.   ____ Use CHG Soap or sage wipes as directed on instruction sheet   ____ Use inhalers on the day of surgery and bring to hospital day of surgery  ____ Stop Metformin and Janumet 2 days prior to surgery.  ____ Take 1/2 of usual insulin dose the night before surgery and none on the morning  surgery.   ____ Follow recommendations from Cardiologist, Pulmonologist or PCP regarding stopping Aspirin, Coumadin, Plavix ,Eliquis, Effient, or Pradaxa, and Pletal.  X____Stop Anti-inflammatories such as Advil, Aleve, Ibuprofen, Motrin, Naproxen, LODINE,  Naprosyn, BC powders or aspirin products NOW-OK to take Tylenol    _x___ Stop supplements until after surgery-STOP BIOTIN NOW-MAY RESUME AFTER SURGERY   ____ Bring C-Pap to the hospital.

## 2019-04-20 ENCOUNTER — Encounter: Payer: Self-pay | Admitting: Orthopedic Surgery

## 2019-04-20 NOTE — H&P (Signed)
ORTHOPAEDIC HISTORY & PHYSICAL  Progress Notes by Lamar Benes., MD at 04/02/2019 10:30 AM  Chief Complaint:     Chief Complaint  Patient presents with  . Follow-up    Left knee cyst    Reason for Visit: The patient is a 54 y.o. female who presents today for reevaluation of her left knee. She reports a several month history of a "knot" to the antero-medial aspect of the left knee.  She noted the onset of the swelling after she felt a "pop" across the front of her knee when she was getting into her jeep.  The lesion has somewhat increased in size.  She has some mild discomfort with deep palpation of the site.  She denies any generalized swelling to the knee.  She denies any locking, popping, or giving way of the knee. The patient has not appreciated any significant improvement despite Tylenol, ice, and activity modification. She is not using any ambulatory aids.  Medications: Current Medications        Current Outpatient Medications  Medication Sig Dispense Refill  . acetaminophen (TYLENOL ARTHRITIS ORAL) Take 2 tablets by mouth once daily       . ALPRAZolam (XANAX) 0.25 MG tablet Take 1 tablet (0.25 mg total) by mouth every 6 (six) hours as needed for Anxiety. (Patient taking differently: Take 0.25 mg by mouth nightly.  ) 60 tablet 5  . ASPIRIN/SALICYLAMIDE/CAFFEINE (BC HEADACHE POWDER ORAL) Take by mouth once daily as needed.    Marland Kitchen buPROPion (WELLBUTRIN SR) 150 MG SR tablet Take two pills in the morning, one at night.       . etodolac (LODINE) 400 MG tablet TAKE 1 TABLET BY MOUTH 2 TIMES DAILY 180 tablet 3  . latanoprost (XALATAN) 0.005 % ophthalmic solution Place 1 drop into both eyes nightly       . methocarbamoL (ROBAXIN) 500 MG tablet TAKE 1 TABLET BY MOUTH 2 TIMES DAILY 180 tablet 3  . methylphenidate HCl (RITALIN) 10 MG tablet Take 10 mg by mouth 2 (two) times daily    . NUVAIL 16 % NFSo APPLY TO AFFECTED TOENAILS EVERY DAY    . omeprazole (PRILOSEC) 20  MG DR capsule Take 1 capsule (20 mg total) by mouth 2 (two) times daily 180 capsule 3  . propranoloL (INDERAL LA) 60 MG LA capsule Take 1 capsule (60 mg total) by mouth once daily 30 capsule 11  . sertraline (ZOLOFT) 100 MG tablet Take 100 mg by mouth once daily       . simvastatin (ZOCOR) 40 MG tablet Take 1 tablet (40 mg total) by mouth once daily 90 tablet 3  . travoprost (TRAVATAN Z) 0.004 % Ophth ophthalmic solution Apply to eye    . travoprost, benzalkonium, (TRAVATAN) 0.004 % ophthalmic solution Place 1 drop into both eyes nightly.    . valsartan (DIOVAN) 160 MG tablet Take 1 tablet (160 mg total) by mouth once daily 90 tablet 3  . VYTONE 1.9-1 % CrPk APPLY TOPICALLY TO THE AFFECTED AREA UNDER breasth AND lower adomen    . zaleplon (SONATA) 10 MG capsule Take 10 mg by mouth nightly        No current facility-administered medications for this visit.       Allergies: No Known Allergies  Past Medical History:     Past Medical History:  Diagnosis Date  . Anxiety associated with depression   . Arthritis   . Benign hypertension   . Chickenpox   . Depression   .  Fibroids    uterine  . GERD (gastroesophageal reflux disease)   . Glaucoma (increased eye pressure)   . History of iron deficiency   . Hyperglycemia   . Hyperlipidemia   . Hypertension   . Hypothyroidism   . Lumbago   . Obesity   . Osteoarthritis     Past Surgical History:      Past Surgical History:  Procedure Laterality Date  . COLONOSCOPY  03/16/2014   Int Hemorrhoids: CBF 02/2024  . EGD  03/16/2014   No repeat per RTE (dw)    Social History: Social History  Social History        Socioeconomic History  . Marital status: Single    Spouse name: Not on file  . Number of children: 0  . Years of education: 7  . Highest education level: Bachelor's degree (e.g., BA, AB, BS)  Occupational History  . Occupation: Therapist, sports at [employer]  . Financial  resource strain: Not on file  . Food insecurity    Worry: Not on file    Inability: Not on file  . Transportation needs    Medical: Not on file    Non-medical: Not on file  Tobacco Use  . Smoking status: Never Smoker  . Smokeless tobacco: Never Used  Substance and Sexual Activity  . Alcohol use: Yes    Comment: Occasionally  . Drug use: No  . Sexual activity: Yes    Partners: Male    Birth control/protection: OCP  Lifestyle  . Physical activity    Days per week: Not on file    Minutes per session: Not on file  . Stress: Not on file  Relationships  . Social Herbalist on phone: Not on file    Gets together: Not on file    Attends religious service: Not on file    Active member of club or organization: Not on file    Attends meetings of clubs or organizations: Not on file    Relationship status: Not on file  Other Topics Concern  . Not on file  Social History Narrative  . Not on file      Family History: Family History  Problem Relation Age of Onset  . Heart murmur Mother   . High blood pressure (Hypertension) Mother   . Hyperlipidemia (Elevated cholesterol) Mother   . No Known Problems Father   . No Known Problems Brother     Review of Systems: A comprehensive 14 point ROS was performed, reviewed, and the pertinent orthopaedic findings are documented in the HPI.  Exam BP (!) 150/90   Temp 36.5 C (97.7 F) (Skin)   Ht 154.9 cm (5\' 1" )   Wt 90.8 kg (200 lb 3.2 oz)   BMI 37.83 kg/m   General:  Well-developed, well-nourished female seen in no acute distress.  Even heel to toe gait. No varus or valgus thrust to the left knee.  HEENT:  Atraumatic, normocephalic.  Pupils are equal and reactive to light.  Extraocular motion is intact. Sclera are clear.  Oropharynx is clear with moist mucosa.  Neck:  Supple, nontender, and with good ROM. No thyromegaly, adenopathy, JVD, or carotid bruits.  Lungs:  Clear  to auscultation bilaterally.  Cardiovascular:  Regular rate and rhythm.  Normal S1, S2.  No murmur .  No appreciable gallops or rubs. Peripheral pulses are palpable.  No lower extremity edema.  Homan`s test is negative.  Abdomen:  Soft, nontender, nondistended.  Bowel sounds are present.  Extremities: Good strength, stability, and range of motion of the upper extremities. Good range of motion of the hips and ankles.  Left  Knee:    Soft tissue swelling: There is a 2 to 3 mm slightly fluctuant, nonpulsatile mass to the anteromedial aspect of the knee    Effusion:                   none    Erythema:                 none    Crepitance:               none    Tenderness:             Mild tenderness to palpation of the cystic lesion with deep palpation    Alignment:                normal    Mediolateral laxity:   stable    Posterior sag:           negative    Patellar tracking:      Good tracking without evidence of subluxation or tilt    Atrophy:                    No significant atrophy.                                       Quadriceps tone was fair to good.    Range of motion:     Greater than 120 degrees  Neurologic:  Awake, alert, and oriented.  Sensory function is intact to pinprick and light touch.   Motor strength is judged to be 5/5.   Motor coordination is within normal limits.   No apparent clonus. No tremor.    X-rays: I reviewed the left knee radiographs that were performed at [facility] on 03/10/2019. Good preservation of the cartilage space. No chondrocalcinosis is appreciated. No significant osteophyte formation or subchondral sclerosis. No evidence of fracture or dislocation.    MRI: I reviewed the left knee MRI performed at [facility] on 03/12/2019.  I concur with the radiologist's interpretation as below:  MRI OF THE LEFT KNEE WITHOUT CONTRAST  TECHNIQUE: Multiplanar, multisequence MR imaging of the knee was performed.  No intravenous contrast was administered.  COMPARISON: None.  FINDINGS: MENISCI  Medial meniscus: Intact.  Lateral meniscus: Intact.  LIGAMENTS  Cruciates: Intact.  Collaterals: Intact.  CARTILAGE  Patellofemoral: A small focal fissure is seen in hyaline cartilage of the lateral patellar facet in the lower pole. Cartilage thinning along the inferior aspect of the medial femoral trochlea is also identified.  Medial: Preserved.  Lateral: Preserved  Joint: Trace amount of joint fluid. There is edema in Hoffa's fat off the inferior pole of the lateral patellar facet.  Popliteal Fossa: No Baker's cyst.  Extensor Mechanism: Intact.  Bones: No fracture or worrisome lesion. Tiny subchondral cysts in the inferior aspect of the medial femoral trochlea noted.  Other: There is a septated cystic lesion extending from the level of the medial metaphysis of the femur into the subcutaneous tissues. Discrete measurement is not possible but the lesion is approximately 4.7 cm transverse by 2 cm AP by up to 3.3 cm craniocaudal. The lesion is just distal to the vastus medialis muscle belly and does not  appear to communicate the joint.  IMPRESSION: Large cystic lesion along the medial aspect of the knee extending from the metaphysis of the femur into the subcutaneous tissues is likely a ganglion cyst. It does not appear to communicate the joint.  Negative for meniscal or ligament tear.  Mild patellofemoral osteoarthritis.  Electronically Signed  By: Inge Rise M.D.  On: 03/12/2019 12:52   Impression: Left knee cyst Family history of malignant hyperthermia  Plan:   The findings were discussed in detail with the patient.  Conservative treatment options were reviewed with the patient.  We discussed the risks and benefits of surgical intervention.  The usual perioperative course was also discussed in detail.  The patient expressed understanding of the risks  and benefits of surgical intervention and would like to proceed with plans for excision of the left knee cyst.  Of note, the patient does have a family history of malignant hyperthermia.  This information will be shared with Anesthesiology.  MEDICAL CLEARANCE: Per anesthesiology. ACTIVITY: As tolerated. WORK STATUS: Anticipate out of work for 1 to 2 weeks THERAPY: Preoperative physical therapy evaluation. MEDICATIONS: Requested Prescriptions    No prescriptions requested or ordered in this encounter   FOLLOW-UP: Return for preop History & Physical pending surgery date.  James P. Holley Bouche., M.D.  This note was generated in part with voice recognition software and I apologize for any typographical errors that were not detected and corrected.     Electronically signed by Lamar Benes., MD on 04/06/2019 5:38 PM

## 2019-04-21 ENCOUNTER — Encounter
Admission: RE | Disposition: A | Discharge status: Home / Self Care | Payer: Self-pay | Source: Home / Self Care | Attending: Orthopedic Surgery

## 2019-04-21 ENCOUNTER — Ambulatory Visit
Admission: RE | Admit: 2019-04-21 | Discharge: 2019-04-21 | Disposition: A | Discharge status: Home / Self Care | Payer: 59 | Attending: Orthopedic Surgery | Admitting: Orthopedic Surgery

## 2019-04-21 ENCOUNTER — Encounter: Payer: Self-pay | Admitting: Orthopedic Surgery

## 2019-04-21 ENCOUNTER — Ambulatory Visit: Payer: 59 | Admitting: Anesthesiology

## 2019-04-21 ENCOUNTER — Other Ambulatory Visit: Payer: Self-pay

## 2019-04-21 DIAGNOSIS — E669 Obesity, unspecified: Secondary | ICD-10-CM | POA: Insufficient documentation

## 2019-04-21 DIAGNOSIS — F419 Anxiety disorder, unspecified: Secondary | ICD-10-CM | POA: Diagnosis not present

## 2019-04-21 DIAGNOSIS — Z79899 Other long term (current) drug therapy: Secondary | ICD-10-CM | POA: Insufficient documentation

## 2019-04-21 DIAGNOSIS — Z8249 Family history of ischemic heart disease and other diseases of the circulatory system: Secondary | ICD-10-CM | POA: Diagnosis not present

## 2019-04-21 DIAGNOSIS — K219 Gastro-esophageal reflux disease without esophagitis: Secondary | ICD-10-CM | POA: Insufficient documentation

## 2019-04-21 DIAGNOSIS — M67462 Ganglion, left knee: Secondary | ICD-10-CM | POA: Insufficient documentation

## 2019-04-21 DIAGNOSIS — Z9889 Other specified postprocedural states: Secondary | ICD-10-CM

## 2019-04-21 DIAGNOSIS — M1990 Unspecified osteoarthritis, unspecified site: Secondary | ICD-10-CM | POA: Insufficient documentation

## 2019-04-21 DIAGNOSIS — H409 Unspecified glaucoma: Secondary | ICD-10-CM | POA: Insufficient documentation

## 2019-04-21 DIAGNOSIS — F329 Major depressive disorder, single episode, unspecified: Secondary | ICD-10-CM | POA: Diagnosis not present

## 2019-04-21 DIAGNOSIS — D2372 Other benign neoplasm of skin of left lower limb, including hip: Secondary | ICD-10-CM | POA: Diagnosis present

## 2019-04-21 DIAGNOSIS — Z7982 Long term (current) use of aspirin: Secondary | ICD-10-CM | POA: Diagnosis not present

## 2019-04-21 DIAGNOSIS — Z6838 Body mass index (BMI) 38.0-38.9, adult: Secondary | ICD-10-CM | POA: Insufficient documentation

## 2019-04-21 DIAGNOSIS — E785 Hyperlipidemia, unspecified: Secondary | ICD-10-CM | POA: Insufficient documentation

## 2019-04-21 DIAGNOSIS — I10 Essential (primary) hypertension: Secondary | ICD-10-CM | POA: Diagnosis not present

## 2019-04-21 HISTORY — PX: EAR CYST EXCISION: SHX22

## 2019-04-21 SURGERY — EXCISION, SYNOVIAL CYST, POPLITEAL SPACE
Anesthesia: General | Site: Knee | Laterality: Left

## 2019-04-21 MED ORDER — LACTATED RINGERS IV SOLN: INTRAVENOUS | Status: DC

## 2019-04-21 MED ORDER — PROPOFOL 10 MG/ML IV BOLUS
INTRAVENOUS | Status: AC
  Filled 2019-04-21: qty 20

## 2019-04-21 MED ORDER — SODIUM CHLORIDE 0.9 % IV SOLN: INTRAVENOUS | Status: DC

## 2019-04-21 MED ORDER — MIDAZOLAM HCL 2 MG/2ML IJ SOLN
INTRAMUSCULAR | Status: DC | PRN
  Administered 2019-04-21: 2 mg via INTRAVENOUS

## 2019-04-21 MED ORDER — CHLORHEXIDINE GLUCONATE 4 % EX LIQD: 60.0000 mL | Freq: Once | CUTANEOUS | Status: DC

## 2019-04-21 MED ORDER — HYDROCODONE-ACETAMINOPHEN 5-325 MG PO TABS: 1.0000 | ORAL_TABLET | ORAL | 0 refills | Status: DC | PRN

## 2019-04-21 MED ORDER — PROPOFOL 500 MG/50ML IV EMUL
INTRAVENOUS | Status: DC | PRN
  Administered 2019-04-21: 150 ug/kg/min via INTRAVENOUS

## 2019-04-21 MED ORDER — ONDANSETRON HCL 4 MG/2ML IJ SOLN: 4.0000 mg | Freq: Four times a day (QID) | INTRAMUSCULAR | Status: DC | PRN

## 2019-04-21 MED ORDER — ACETAMINOPHEN 10 MG/ML IV SOLN
INTRAVENOUS | Status: AC
  Filled 2019-04-21: qty 100

## 2019-04-21 MED ORDER — DEXAMETHASONE SODIUM PHOSPHATE 10 MG/ML IJ SOLN
INTRAMUSCULAR | Status: AC
  Filled 2019-04-21: qty 1

## 2019-04-21 MED ORDER — PHENYLEPHRINE HCL (PRESSORS) 10 MG/ML IV SOLN
INTRAVENOUS | Status: DC | PRN
  Administered 2019-04-21: 200 ug via INTRAVENOUS
  Administered 2019-04-21: 100 ug via INTRAVENOUS
  Administered 2019-04-21 (×2): 200 ug via INTRAVENOUS
  Administered 2019-04-21: 100 ug via INTRAVENOUS
  Administered 2019-04-21: 200 ug via INTRAVENOUS
  Administered 2019-04-21: 100 ug via INTRAVENOUS
  Administered 2019-04-21: 200 ug via INTRAVENOUS

## 2019-04-21 MED ORDER — FENTANYL CITRATE (PF) 100 MCG/2ML IJ SOLN
INTRAMUSCULAR | Status: AC
  Filled 2019-04-21: qty 2

## 2019-04-21 MED ORDER — PROPOFOL 10 MG/ML IV BOLUS
INTRAVENOUS | Status: DC | PRN
  Administered 2019-04-21: 50 mg via INTRAVENOUS
  Administered 2019-04-21: 20 mg via INTRAVENOUS
  Administered 2019-04-21: 100 mg via INTRAVENOUS
  Administered 2019-04-21: 200 mg via INTRAVENOUS
  Administered 2019-04-21: 50 mg via INTRAVENOUS
  Administered 2019-04-21: 100 mg via INTRAVENOUS

## 2019-04-21 MED ORDER — LIDOCAINE HCL (PF) 2 % IJ SOLN
INTRAMUSCULAR | Status: AC
  Filled 2019-04-21: qty 10

## 2019-04-21 MED ORDER — ONDANSETRON HCL 4 MG PO TABS: 4.0000 mg | ORAL_TABLET | Freq: Four times a day (QID) | ORAL | Status: DC | PRN

## 2019-04-21 MED ORDER — ACETAMINOPHEN 10 MG/ML IV SOLN
INTRAVENOUS | Status: DC | PRN
  Administered 2019-04-21: 1000 mg via INTRAVENOUS

## 2019-04-21 MED ORDER — FENTANYL CITRATE (PF) 100 MCG/2ML IJ SOLN: 25.0000 ug | INTRAMUSCULAR | Status: DC | PRN

## 2019-04-21 MED ORDER — DEXMEDETOMIDINE HCL IN NACL 80 MCG/20ML IV SOLN
INTRAVENOUS | Status: AC
  Filled 2019-04-21: qty 20

## 2019-04-21 MED ORDER — CELECOXIB 200 MG PO CAPS
ORAL_CAPSULE | ORAL | Status: AC
  Administered 2019-04-21: 15:00:00 400 mg via ORAL
  Filled 2019-04-21: qty 2

## 2019-04-21 MED ORDER — DEXMEDETOMIDINE HCL IN NACL 200 MCG/50ML IV SOLN
INTRAVENOUS | Status: DC | PRN
  Administered 2019-04-21: 20 ug via INTRAVENOUS
  Administered 2019-04-21: 16 ug via INTRAVENOUS

## 2019-04-21 MED ORDER — FENTANYL CITRATE (PF) 100 MCG/2ML IJ SOLN
INTRAMUSCULAR | Status: DC | PRN
  Administered 2019-04-21: 25 ug via INTRAVENOUS
  Administered 2019-04-21: 50 ug via INTRAVENOUS
  Administered 2019-04-21: 25 ug via INTRAVENOUS

## 2019-04-21 MED ORDER — CELECOXIB 200 MG PO CAPS: 400.0000 mg | ORAL_CAPSULE | Freq: Once | ORAL | Status: AC

## 2019-04-21 MED ORDER — OXYCODONE HCL 5 MG/5ML PO SOLN: 5.0000 mg | Freq: Once | ORAL | Status: DC | PRN

## 2019-04-21 MED ORDER — DEXAMETHASONE SODIUM PHOSPHATE 10 MG/ML IJ SOLN
INTRAMUSCULAR | Status: DC | PRN
  Administered 2019-04-21: 10 mg via INTRAVENOUS

## 2019-04-21 MED ORDER — ONDANSETRON HCL 4 MG/2ML IJ SOLN
INTRAMUSCULAR | Status: AC
  Filled 2019-04-21: qty 2

## 2019-04-21 MED ORDER — ONDANSETRON HCL 4 MG/2ML IJ SOLN
INTRAMUSCULAR | Status: DC | PRN
  Administered 2019-04-21: 4 mg via INTRAVENOUS

## 2019-04-21 MED ORDER — PROPOFOL 500 MG/50ML IV EMUL
INTRAVENOUS | Status: AC
  Filled 2019-04-21: qty 50

## 2019-04-21 MED ORDER — CEFAZOLIN SODIUM-DEXTROSE 2-4 GM/100ML-% IV SOLN
2.0000 g | INTRAVENOUS | Status: AC
  Administered 2019-04-21: 2 g via INTRAVENOUS

## 2019-04-21 MED ORDER — PROMETHAZINE HCL 25 MG/ML IJ SOLN: 6.2500 mg | INTRAMUSCULAR | Status: DC | PRN

## 2019-04-21 MED ORDER — METOCLOPRAMIDE HCL 10 MG PO TABS: 5.0000 mg | ORAL_TABLET | Freq: Three times a day (TID) | ORAL | Status: DC | PRN

## 2019-04-21 MED ORDER — NEOMYCIN-POLYMYXIN B GU 40-200000 IR SOLN
Status: DC | PRN
  Administered 2019-04-21: 2 mL

## 2019-04-21 MED ORDER — MIDAZOLAM HCL 2 MG/2ML IJ SOLN
INTRAMUSCULAR | Status: AC
  Filled 2019-04-21: qty 2

## 2019-04-21 MED ORDER — CEFAZOLIN SODIUM-DEXTROSE 2-4 GM/100ML-% IV SOLN
INTRAVENOUS | Status: AC
  Filled 2019-04-21: qty 100

## 2019-04-21 MED ORDER — METOCLOPRAMIDE HCL 5 MG/ML IJ SOLN: 5.0000 mg | Freq: Three times a day (TID) | INTRAMUSCULAR | Status: DC | PRN

## 2019-04-21 MED ORDER — LIDOCAINE HCL (CARDIAC) PF 100 MG/5ML IV SOSY
PREFILLED_SYRINGE | INTRAVENOUS | Status: DC | PRN
  Administered 2019-04-21: 100 mg via INTRAVENOUS

## 2019-04-21 MED ORDER — MEPERIDINE HCL 50 MG/ML IJ SOLN: 6.2500 mg | INTRAMUSCULAR | Status: DC | PRN

## 2019-04-21 MED ORDER — OXYCODONE HCL 5 MG PO TABS: 5.0000 mg | ORAL_TABLET | Freq: Once | ORAL | Status: DC | PRN

## 2019-04-21 MED ORDER — BUPIVACAINE-EPINEPHRINE 0.25% -1:200000 IJ SOLN
INTRAMUSCULAR | Status: DC | PRN
  Administered 2019-04-21: 15 mL

## 2019-04-21 SURGICAL SUPPLY — 59 items
CANISTER SUCT 1200ML W/VALVE (MISCELLANEOUS) ×2 IMPLANT
CANISTER SUCT 3000ML PPV (MISCELLANEOUS) ×4 IMPLANT
COOLER POLAR GLACIER W/PUMP (MISCELLANEOUS) ×1 IMPLANT
COVER WAND RF STERILE (DRAPES) ×2 IMPLANT
CUFF TOURN SGL QUICK 24 (TOURNIQUET CUFF)
CUFF TOURN SGL QUICK 30 (TOURNIQUET CUFF)
CUFF TRNQT CYL 24X4X16.5-23 (TOURNIQUET CUFF) IMPLANT
CUFF TRNQT CYL 30X4X21-28X (TOURNIQUET CUFF) IMPLANT
DRSG DERMACEA 8X12 NADH (GAUZE/BANDAGES/DRESSINGS) ×2 IMPLANT
DRSG OPSITE POSTOP 3X4 (GAUZE/BANDAGES/DRESSINGS) ×1 IMPLANT
DRSG OPSITE POSTOP 4X14 (GAUZE/BANDAGES/DRESSINGS) ×2 IMPLANT
DURAPREP 26ML APPLICATOR (WOUND CARE) ×3 IMPLANT
ELECT REM PT RETURN 9FT ADLT (ELECTROSURGICAL) ×2
ELECTRODE REM PT RTRN 9FT ADLT (ELECTROSURGICAL) ×1 IMPLANT
GAUZE SPONGE 4X4 12PLY STRL (GAUZE/BANDAGES/DRESSINGS) ×1 IMPLANT
GLOVE BIOGEL M STRL SZ7.5 (GLOVE) ×4 IMPLANT
GLOVE INDICATOR 8.0 STRL GRN (GLOVE) ×2 IMPLANT
GOWN STRL REUS W/ TWL LRG LVL3 (GOWN DISPOSABLE) ×2 IMPLANT
GOWN STRL REUS W/TWL LRG LVL3 (GOWN DISPOSABLE) ×2
HOLDER FOLEY CATH W/STRAP (MISCELLANEOUS) ×1 IMPLANT
HOOD PEEL AWAY FLYTE STAYCOOL (MISCELLANEOUS) ×2 IMPLANT
KIT TURNOVER KIT A (KITS) ×2 IMPLANT
MANIFOLD NEPTUNE II (INSTRUMENTS) ×2 IMPLANT
NDL SAFETY ECLIPSE 18X1.5 (NEEDLE) ×1 IMPLANT
NDL SPNL 20GX3.5 QUINCKE YW (NEEDLE) ×1 IMPLANT
NEEDLE HYPO 18GX1.5 SHARP (NEEDLE) ×1
NEEDLE SPNL 20GX3.5 QUINCKE YW (NEEDLE) ×2 IMPLANT
NS IRRIG 500ML POUR BTL (IV SOLUTION) ×2 IMPLANT
PACK TOTAL KNEE (MISCELLANEOUS) ×2 IMPLANT
PAD CAST CTTN 4X4 STRL (SOFTGOODS) IMPLANT
PAD WRAPON POLAR KNEE (MISCELLANEOUS) ×1 IMPLANT
PADDING CAST COTTON 4X4 STRL (SOFTGOODS) ×1
PENCIL SMOKE ULTRAEVAC 22 CON (MISCELLANEOUS) ×2 IMPLANT
PIN FIXATION 1/8DIA X 3INL (PIN) ×6 IMPLANT
PULSAVAC PLUS IRRIG FAN TIP (DISPOSABLE) ×2
SOL .9 NS 3000ML IRR  AL (IV SOLUTION) ×1
SOL .9 NS 3000ML IRR UROMATIC (IV SOLUTION) ×1 IMPLANT
SOL PREP PVP 2OZ (MISCELLANEOUS) ×2
SOLUTION PREP PVP 2OZ (MISCELLANEOUS) ×1 IMPLANT
SPONGE DRAIN TRACH 4X4 STRL 2S (GAUZE/BANDAGES/DRESSINGS) ×1 IMPLANT
STAPLER SKIN PROX 35W (STAPLE) ×1 IMPLANT
STOCKINETTE BIAS CUT 6 980064 (GAUZE/BANDAGES/DRESSINGS) ×1 IMPLANT
STRIP CLOSURE SKIN 1/2X4 (GAUZE/BANDAGES/DRESSINGS) ×1 IMPLANT
SUCTION FRAZIER HANDLE 10FR (MISCELLANEOUS) ×1
SUCTION TUBE FRAZIER 10FR DISP (MISCELLANEOUS) ×1 IMPLANT
SUT MNCRL AB 4-0 PS2 18 (SUTURE) ×2 IMPLANT
SUT MON AB 2-0 CT1 36 (SUTURE) ×2 IMPLANT
SUT PROLENE 1 CT 1 30 (SUTURE) ×2 IMPLANT
SUT VIC AB 0 CT1 36 (SUTURE) ×2 IMPLANT
SUT VIC AB 1 CT1 36 (SUTURE) ×4 IMPLANT
SUT VIC AB 2-0 CT2 27 (SUTURE) ×2 IMPLANT
SYR 20ML LL LF (SYRINGE) ×2 IMPLANT
SYR 30ML LL (SYRINGE) ×2 IMPLANT
SYR 50ML LL SCALE MARK (SYRINGE) ×2 IMPLANT
TIP FAN IRRIG PULSAVAC PLUS (DISPOSABLE) ×1 IMPLANT
TOWEL OR 17X26 4PK STRL BLUE (TOWEL DISPOSABLE) ×2 IMPLANT
TRAY FOLEY MTR SLVR 16FR STAT (SET/KITS/TRAYS/PACK) ×1 IMPLANT
WRAP KNEE W/COLD PACKS 25.5X14 (SOFTGOODS) ×1 IMPLANT
WRAPON POLAR PAD KNEE (MISCELLANEOUS)

## 2019-04-21 NOTE — Anesthesia Preprocedure Evaluation (Addendum)
Anesthesia Evaluation  Patient identified by MRN, date of birth, ID band Patient awake    Reviewed: Allergy & Precautions, NPO status , Patient's Chart, lab work & pertinent test results  History of Anesthesia Complications (+) MALIGNANT HYPERTHERMIA, Family history of anesthesia reaction and history of anesthetic complications (family hx of MH in maternal aunt and uncle)  Airway Mallampati: II  TM Distance: >3 FB Neck ROM: Full    Dental no notable dental hx.    Pulmonary neg pulmonary ROS, neg sleep apnea, neg COPD,    breath sounds clear to auscultation- rhonchi (-) wheezing      Cardiovascular hypertension, Pt. on medications (-) CAD, (-) Past MI, (-) Cardiac Stents and (-) CABG  Rhythm:Regular Rate:Normal - Systolic murmurs and - Diastolic murmurs    Neuro/Psych neg Seizures PSYCHIATRIC DISORDERS Depression negative neurological ROS     GI/Hepatic Neg liver ROS, GERD  ,  Endo/Other  negative endocrine ROSneg diabetes  Renal/GU negative Renal ROS     Musculoskeletal  (+) Arthritis ,   Abdominal (+) + obese,   Peds  Hematology negative hematology ROS (+)   Anesthesia Other Findings Past Medical History: No date: Arthritis No date: Depression No date: Esophageal reflux No date: Family history of adverse reaction to anesthesia     Comment:  maternal aunt and maternal uncle and aunts daughter have              malignant hyperthermia No date: Fibroids No date: Glaucoma No date: Herpes genitalia No date: Hypercholesteremia No date: Hypertension No date: Malignant hyperthermia     Comment:  HUGE FAMILY HISTORY OF MH-PTS MATERNAL AUNT AND UNCLE               AND COUSIN-PT HAS NEVER HAD ANY ISSUES BUT SHE HAS ONLY               HAD COLONOSCOPY AND EGD No date: Thyroid disease   Reproductive/Obstetrics                             Anesthesia Physical Anesthesia Plan  ASA:  II  Anesthesia Plan: General   Post-op Pain Management:    Induction: Intravenous  PONV Risk Score and Plan: 2 and Ondansetron, Dexamethasone and Midazolam  Airway Management Planned: LMA  Additional Equipment:   Intra-op Plan:   Post-operative Plan:   Informed Consent: I have reviewed the patients History and Physical, chart, labs and discussed the procedure including the risks, benefits and alternatives for the proposed anesthesia with the patient or authorized representative who has indicated his/her understanding and acceptance.     Dental advisory given  Plan Discussed with: CRNA and Anesthesiologist  Anesthesia Plan Comments:         Anesthesia Quick Evaluation

## 2019-04-21 NOTE — Anesthesia Procedure Notes (Signed)
Procedure Name: LMA Insertion Date/Time: 04/21/2019 3:45 PM Performed by: Doreen Salvage, CRNA Pre-anesthesia Checklist: Patient identified, Patient being monitored, Timeout performed, Emergency Drugs available and Suction available Patient Re-evaluated:Patient Re-evaluated prior to induction Oxygen Delivery Method: Circle system utilized Preoxygenation: Pre-oxygenation with 100% oxygen Induction Type: IV induction Ventilation: Mask ventilation without difficulty LMA: LMA inserted LMA Size: 3.0 Tube type: Oral Number of attempts: 1 Placement Confirmation: positive ETCO2 and breath sounds checked- equal and bilateral Tube secured with: Tape Dental Injury: Teeth and Oropharynx as per pre-operative assessment

## 2019-04-21 NOTE — Op Note (Signed)
OPERATIVE NOTE  DATE OF SURGERY:  04/21/2019  PATIENT NAME:  Olivia Sanchez   DOB: January 23, 1966  MRN: [id number]   PRE-OPERATIVE DIAGNOSIS: Cystic lesion to the left knee  POST-OPERATIVE DIAGNOSIS:  Same (pathology pending)  PROCEDURE: Excision of cystic mass from the left knee  SURGEON:  Marciano Sequin., M.D.   ASSISTANT: None  ANESTHESIA: general  ESTIMATED BLOOD LOSS: Minimal  FLUIDS REPLACED: 900 mL of crystalloid  TOURNIQUET TIME: 22 minutes  DRAINS: None  INDICATIONS FOR SURGERY: [name] is a 54 y.o. year old female who has been seen for complaints of a cystic mass to the left knee.  MRI demonstrated findings consistent with a multiloculated cystic lesion.. After discussion of the risks and benefits of surgical intervention, the patient expressed understanding of the risks benefits and agree with plans for excision of the cystic mass.   PROCEDURE IN DETAIL: The patient was brought into the operating room and, after adequate general anesthesia was achieved, tourniquet was placed on the patient's upper left thigh.  The patient's left knee and leg were cleaned and prepped with alcohol and DuraPrep and draped in the usual sterile fashion.  A "timeout" was performed as per usual protocol.  The left lower extremity was exsanguinated using an Esmarch, the tourniquet was inflated to 300 mmHg.  The cystic mass was visible to the anteromedial aspect of the knee just superior to the patella.  A longitudinal incision was made directly over the mass.  Careful blunt dissection was carried out around the cyst using Metzenbaum scissors.  The lesion had multiple septations and tracked deep.  An attempt was made to trace the stalk of the cyst, but the cyst appeared to have a blind pouch.  It was excised sharply and the specimen was submitted to pathology.  The dimensions of the lesion measured approximately 4 cm x 3 cm.  The wound was explored for any other smaller cyst.  Wound was  irrigated with copious amounts normal saline with antibiotic solution and suctioned dry.  0.25% Marcaine with epinephrine was injected along the wound bed.  Deep sutures were placed using #2-0 Vicryl.  Subcutaneous tissue was approximated layers using first #2-0 Vicryl followed by a running subcuticular suture of #4-0 Vicryl.  Steri-Strips were applied.  A sterile dressing was applied.  The patient tolerated the procedure well.  She was transported to the recovery room in stable condition.   Arjun Hard P. Holley Bouche M.D.

## 2019-04-21 NOTE — Transfer of Care (Signed)
Immediate Anesthesia Transfer of Care Note  Patient: Olivia Sanchez  Procedure(s) Performed: Procedure(s): EXCISION OF CYST FROM LEFT KNEE (Left)  Patient Location: PACU  Anesthesia Type:General  Level of Consciousness: sedated  Airway & Oxygen Therapy: Patient Spontanous Breathing and Patient connected to face mask oxygen  Post-op Assessment: Report given to RN and Post -op Vital signs reviewed and stable  Post vital signs: Reviewed and stable  Last Vitals:  Vitals:   04/21/19 1706 04/21/19 1711  BP:  (!) 89/61  Pulse:  87  Resp:  (!) 21  Temp: (P) 36.4 C   SpO2:  [id number]    Complications: No apparent anesthesia complications

## 2019-04-21 NOTE — Discharge Instructions (Signed)
AMBULATORY SURGERY  DISCHARGE INSTRUCTIONS   1) The drugs that you were given will stay in your system until tomorrow so for the next 24 hours you should not:  A) Drive an automobile B) Make any legal decisions C) Drink any alcoholic beverage   2) You may resume regular meals tomorrow.  Today it is better to start with liquids and gradually work up to solid foods.  You may eat anything you prefer, but it is better to start with liquids, then soup and crackers, and gradually work up to solid foods.   3) Please notify your doctor immediately if you have any unusual bleeding, trouble breathing, redness and pain at the surgery site, drainage, fever, or pain not relieved by medication.    4) Additional Instructions:        Please contact your physician with any problems or Same Day Surgery at [phone], Monday through Friday 6 am to 4 pm, or [place] at [facility] number at [phone].    Instructions after Knee Surgery   James P. Holley Bouche., M.D.     Dept. of [facility]  [place], [place]  [address]  Phone: [phone]   Fax: [phone]    DIET: . Drink plenty of non-alcoholic fluids. . Resume your normal diet. Include foods high in fiber.  ACTIVITY:  . You may use crutches or a walker with weight-bearing as tolerated, unless instructed otherwise. . Continue doing gentle exercises. Exercising will reduce the pain and swelling, increase motion, and prevent muscle weakness.   . Do not drive or operate any equipment until instructed.  WOUND CARE:  . Continue to use the PolarCare or ice packs periodically to reduce pain and swelling. . You may bathe or shower after the staples are removed at the first office visit following surgery.  MEDICATIONS: . Dennis Bast may resume your regular medications. . Please take the pain medication as prescribed on the medication. . Do not take pain medication on an empty  stomach. . Do not drive or drink alcoholic beverages when taking pain medications.  CALL THE OFFICE FOR: . Temperature above 101 degrees . Excessive bleeding or drainage on the dressing. . Excessive swelling, coldness, or paleness of the toes. . Persistent nausea and vomiting.  FOLLOW-UP:  . You should have an appointment to return to the office in 7-10 days after surgery.    [facility] Department Directory         [email or web address]       [email or web address]          Cardiology  Appointments: [place] [phone]  Endocrinology  Appointments: [place] [phone] [place] [phone]  Gastroenterology  Appointments: [place] [phone] [place] [phone]        General Surgery   Appointments: [facility]  Internal Medicine/Family Medicine  Appointments: [facility] [place] - [phone]  Metabolic and Gypsum Loss Surgery  Appointments: [facility]        Neurology  Appointments: [place] [phone] [place] - [phone]  Neurosurgery  Appointments: [place]  Obstetrics & Gynecology  Appointments: [place] [phone] [place] - [phone]        Pediatrics  Appointments: [place] [phone] [place]  Appointments: [place] [phone]  Physical Therapy  Appointments: [facility] [place] - [phone]        Podiatry  Appointments: [place] [phone] [place] - [phone]  Pulmonology  Appointments: [place]  Rheumatology  Appointments: [facility]        [facility] Location: [facility]  [place], [place]  [address]  [place] Location: [facility] [address] [place], [place]  [address]  [place] Location: [facility] [address] [place],  [place]  [id number]

## 2019-04-21 NOTE — H&P (Signed)
The patient has been re-examined, and the chart reviewed, and there have been no interval changes to the documented history and physical.    The risks, benefits, and alternatives have been discussed at length. The patient expressed understanding of the risks benefits and agreed with plans for surgical intervention.  Chanti Golubski P. Meganne Rita, Jr. M.D.    

## 2019-04-23 ENCOUNTER — Ambulatory Visit: Payer: 59

## 2019-04-23 LAB — SURGICAL PATHOLOGY

## 2019-04-23 NOTE — Anesthesia Postprocedure Evaluation (Signed)
Anesthesia Post Note  Patient: Olivia Sanchez  Procedure(s) Performed: EXCISION OF CYST FROM LEFT KNEE (Left Knee)  Patient location during evaluation: PACU Anesthesia Type: General Level of consciousness: awake and alert and oriented Pain management: pain level controlled Vital Signs Assessment: post-procedure vital signs reviewed and stable Respiratory status: spontaneous breathing Cardiovascular status: blood pressure returned to baseline Anesthetic complications: no     Last Vitals:  Vitals:   04/21/19 1754 04/21/19 1819  BP: 126/64 123/77  Pulse:  86  Resp:  16  Temp: 36.5 C 36.6 C  SpO2:  100%    Last Pain:  Vitals:   04/22/19 0817  TempSrc:   PainSc: 0-No pain                 Jnae Thomaston

## 2019-05-02 ENCOUNTER — Telehealth: Payer: Self-pay | Admitting: Anesthesiology

## 2019-05-02 NOTE — Consult Note (Signed)
Brief Anesthesia Note  Returned call from pt regarding tongue numbness.  Pt reports after her procedure on 04/21/19 that she noticed a bruise on the right side of her tongue which resolved, but she also noticed that an area toward the tip of her tongue felt strange as if she had drank something too hot and burned it.  This feeling has not gotten better or worse since her procedure.  I counseled her that although it's not possible to know for sure what happened, she may have bitten her tongue or her tongue may have been pinched at some point during her anesthetic (although there is no record of anything unusual happening).  I would expect this to resolve within the next few weeks.  If the feeling does not resolve, I asked her to reach back out to [place] to further discuss.  She expressed understanding and agreed.    Tera Mater

## 2019-05-07 ENCOUNTER — Ambulatory Visit: Payer: 59

## 2019-05-17 ENCOUNTER — Other Ambulatory Visit: Payer: Self-pay

## 2019-05-17 ENCOUNTER — Ambulatory Visit
Admission: RE | Admit: 2019-05-17 | Discharge: 2019-05-17 | Disposition: A | Discharge status: Home / Self Care | Payer: 59 | Source: Ambulatory Visit | Attending: Neurology | Admitting: Neurology

## 2019-05-17 DIAGNOSIS — R519 Headache, unspecified: Secondary | ICD-10-CM | POA: Diagnosis not present

## 2019-05-17 LAB — POCT I-STAT CREATININE: Creatinine, Ser: 0.9 mg/dL (ref 0.44–1.00)

## 2019-05-17 MED ORDER — GADOBUTROL 1 MMOL/ML IV SOLN
10.0000 mL | Freq: Once | INTRAVENOUS | Status: AC | PRN
  Administered 2019-05-17: 10 mL via INTRAVENOUS

## 2019-05-19 DIAGNOSIS — F341 Dysthymic disorder: Secondary | ICD-10-CM | POA: Diagnosis not present

## 2019-05-27 DIAGNOSIS — H52223 Regular astigmatism, bilateral: Secondary | ICD-10-CM | POA: Diagnosis not present

## 2019-05-27 DIAGNOSIS — H5213 Myopia, bilateral: Secondary | ICD-10-CM | POA: Diagnosis not present

## 2019-05-27 DIAGNOSIS — H524 Presbyopia: Secondary | ICD-10-CM | POA: Diagnosis not present

## 2019-05-27 DIAGNOSIS — H40003 Preglaucoma, unspecified, bilateral: Secondary | ICD-10-CM | POA: Diagnosis not present

## 2019-05-29 DIAGNOSIS — E7800 Pure hypercholesterolemia, unspecified: Secondary | ICD-10-CM | POA: Diagnosis not present

## 2019-05-29 DIAGNOSIS — I10 Essential (primary) hypertension: Secondary | ICD-10-CM | POA: Diagnosis not present

## 2019-06-01 DIAGNOSIS — G4733 Obstructive sleep apnea (adult) (pediatric): Secondary | ICD-10-CM | POA: Diagnosis not present

## 2019-06-09 DIAGNOSIS — F341 Dysthymic disorder: Secondary | ICD-10-CM | POA: Diagnosis not present

## 2019-06-27 ENCOUNTER — Telehealth: Payer: Self-pay

## 2019-06-27 NOTE — Telephone Encounter (Signed)
Pt called left message Nuvail and Vytone are discontinued at [facility], can she get something different called in to the pharmacy. Pt's last office visit was 10-24-2018

## 2019-07-01 NOTE — Telephone Encounter (Signed)
May have Vytone mix from [facility] or sister company.  For Nuvail, may substitute: Dermanail By  [employer]  Can order on line at  [email or web address]

## 2019-07-02 NOTE — Telephone Encounter (Signed)
Patient advised of information per Dr. Nehemiah Massed. Vytone RX sent to [facility] for the compounded cream and patient advised can purchase Dermanail OTC.

## 2019-07-03 ENCOUNTER — Other Ambulatory Visit: Payer: Self-pay | Admitting: Neurology

## 2019-07-03 DIAGNOSIS — G5603 Carpal tunnel syndrome, bilateral upper limbs: Secondary | ICD-10-CM | POA: Diagnosis not present

## 2019-07-03 DIAGNOSIS — R519 Headache, unspecified: Secondary | ICD-10-CM | POA: Diagnosis not present

## 2019-07-03 DIAGNOSIS — M542 Cervicalgia: Secondary | ICD-10-CM | POA: Diagnosis not present

## 2019-07-03 DIAGNOSIS — G43009 Migraine without aura, not intractable, without status migrainosus: Secondary | ICD-10-CM | POA: Diagnosis not present

## 2019-07-25 DIAGNOSIS — R739 Hyperglycemia, unspecified: Secondary | ICD-10-CM | POA: Diagnosis not present

## 2019-07-25 DIAGNOSIS — I10 Essential (primary) hypertension: Secondary | ICD-10-CM | POA: Diagnosis not present

## 2019-07-25 DIAGNOSIS — Z79899 Other long term (current) drug therapy: Secondary | ICD-10-CM | POA: Diagnosis not present

## 2019-07-25 DIAGNOSIS — G44221 Chronic tension-type headache, intractable: Secondary | ICD-10-CM | POA: Diagnosis not present

## 2019-07-25 DIAGNOSIS — E7800 Pure hypercholesterolemia, unspecified: Secondary | ICD-10-CM | POA: Diagnosis not present

## 2019-08-04 ENCOUNTER — Other Ambulatory Visit: Payer: Self-pay | Admitting: Psychiatry

## 2019-08-04 DIAGNOSIS — F341 Dysthymic disorder: Secondary | ICD-10-CM | POA: Diagnosis not present

## 2019-08-14 ENCOUNTER — Other Ambulatory Visit: Payer: Self-pay | Admitting: Internal Medicine

## 2019-08-28 DIAGNOSIS — M7551 Bursitis of right shoulder: Secondary | ICD-10-CM | POA: Diagnosis not present

## 2019-09-26 ENCOUNTER — Other Ambulatory Visit: Payer: Self-pay | Admitting: Internal Medicine

## 2019-09-29 DIAGNOSIS — F341 Dysthymic disorder: Secondary | ICD-10-CM | POA: Diagnosis not present

## 2019-09-30 ENCOUNTER — Other Ambulatory Visit: Payer: Self-pay

## 2019-09-30 ENCOUNTER — Other Ambulatory Visit (HOSPITAL_COMMUNITY)
Admission: RE | Admit: 2019-09-30 | Discharge: 2019-09-30 | Disposition: A | Discharge status: Home / Self Care | Payer: 59 | Source: Ambulatory Visit | Attending: Obstetrics & Gynecology | Admitting: Obstetrics & Gynecology

## 2019-09-30 ENCOUNTER — Ambulatory Visit (INDEPENDENT_AMBULATORY_CARE_PROVIDER_SITE_OTHER): Payer: 59 | Admitting: Obstetrics & Gynecology

## 2019-09-30 ENCOUNTER — Encounter: Payer: Self-pay | Admitting: Obstetrics & Gynecology

## 2019-09-30 VITALS — BP 120/80 | Ht 60.0 in | Wt 196.0 lb

## 2019-09-30 DIAGNOSIS — D219 Benign neoplasm of connective and other soft tissue, unspecified: Secondary | ICD-10-CM | POA: Diagnosis not present

## 2019-09-30 DIAGNOSIS — N870 Mild cervical dysplasia: Secondary | ICD-10-CM | POA: Diagnosis not present

## 2019-09-30 NOTE — Progress Notes (Signed)
HPI:  Patient is a 54 y.o. G0P0000 presenting for follow up evaluation of abnormal PAP smear in the past.  Her last PAP was 6 months ago and was normal. She has had a prior colposcopy. Prior biopsies (if done) were CIN I.  She has had normal PAP twice since these biopsies for ASC-H were performed.   Pt has h/o fibroids.  She still has irreg and unpredictable bleeding requiring wearing a pad daily to protect against that.    PMHx: She  has a past medical history of Arthritis, Depression, Esophageal reflux, Family history of adverse reaction to anesthesia, Fibroids, Glaucoma, Herpes genitalia, Hypercholesteremia, Hypertension, Malignant hyperthermia, and Thyroid disease. Also,  has a past surgical history that includes Colonoscopy (02/2014); Cystoscopy; Esophagogastroduodenoscopy; and Ear Cyst Excision (Left, 04/21/2019)., family history includes Breast cancer (age of onset: 86) in her maternal aunt; Dementia in her mother; Hypertension in her mother.,  reports that she has never smoked. She has never used smokeless tobacco. She reports previous alcohol use. She reports that she does not use drugs.  She has a current medication list which includes the following prescription(s): acetaminophen, alprazolam, aspirin-caffeine, biotin w/ vitamins c & e, bupropion, vitamin d, nuvail, etodolac, hydrocodone-acetaminophen, latanoprost, methocarbamol, methylphenidate, norethindrone, omeprazole, propranolol er, sertraline, sertraline, simvastatin, topiramate, valsartan, vytone, and zaleplon. Also, has No Known Allergies.  Review of Systems  All other systems reviewed and are negative.   Objective: There were no vitals taken for this visit. There were no vitals filed for this visit. There is no height or weight on file to calculate BMI.  Physical examination Physical Exam Constitutional:      General: She is not in acute distress.    Appearance: She is well-developed.  Genitourinary:     Pelvic exam was  performed with patient supine.     Vagina and uterus normal.     No vaginal erythema or bleeding.     No cervical motion tenderness, discharge, polyp or nabothian cyst.     Uterus is mobile.     Uterus is not enlarged.     No uterine mass detected.    Uterus is midaxial.     No right or left adnexal mass present.     Right adnexa not tender.     Left adnexa not tender.  HENT:     Head: Normocephalic and atraumatic.     Nose: Nose normal.  Abdominal:     General: There is no distension.     Palpations: Abdomen is soft.     Tenderness: There is no abdominal tenderness.  Musculoskeletal:        General: Normal range of motion.  Neurological:     Mental Status: She is alert and oriented to person, place, and time.     Cranial Nerves: No cranial nerve deficit.  Skin:    General: Skin is warm and dry.  Psychiatric:        Attention and Perception: Attention normal.        Mood and Affect: Mood and affect normal.        Speech: Speech normal.        Behavior: Behavior normal.        Thought Content: Thought content normal.        Judgment: Judgment normal.     ASSESSMENT:  History of Cervical Dysplasia - CIN I. Fibroids  Plan:  1.  I discussed the grading system of pap smears and HPV high risk viral types.   2.  Follow up PAP 12 months, vs intervention if high grade dysplasia identified. 3. Plan Annual in Jan then yearly visits again if normal today 4. Plan [place], discussion of fibroid. AUB treatments if/when pt feels concerned about lingering sx's in this regards  A total of 20 minutes were spent face-to-face with the patient as well as preparation, review, communication, and documentation during this encounter.    Barnett Applebaum, MD, [facility], [facility] 09/30/2019  7:41 AM

## 2019-09-30 NOTE — Patient Instructions (Signed)
Thank you for choosing [facility]. As part of our ongoing efforts to improve patient experience, we would appreciate your feedback. Please fill out the short survey that you will receive by mail or MyChart. Your opinion is important to us! -Dr Francis Doenges  

## 2019-10-01 ENCOUNTER — Other Ambulatory Visit: Payer: Self-pay | Admitting: Obstetrics & Gynecology

## 2019-10-01 LAB — CYTOLOGY - PAP
Adequacy: ABSENT
Diagnosis: NEGATIVE

## 2019-10-01 MED ORDER — METRONIDAZOLE 500 MG PO TABS: 500.0000 mg | ORAL_TABLET | Freq: Two times a day (BID) | ORAL | 0 refills | Status: AC

## 2019-10-06 DIAGNOSIS — M8949 Other hypertrophic osteoarthropathy, multiple sites: Secondary | ICD-10-CM | POA: Diagnosis not present

## 2019-10-06 DIAGNOSIS — Z791 Long term (current) use of non-steroidal anti-inflammatories (NSAID): Secondary | ICD-10-CM | POA: Diagnosis not present

## 2019-10-06 DIAGNOSIS — M7551 Bursitis of right shoulder: Secondary | ICD-10-CM | POA: Diagnosis not present

## 2019-10-08 ENCOUNTER — Other Ambulatory Visit: Payer: Self-pay | Admitting: Optometry

## 2019-10-10 ENCOUNTER — Telehealth: Payer: Self-pay

## 2019-10-10 NOTE — Telephone Encounter (Signed)
Pt called triage and states she was seen recently by [facility]. She noticed she has a Rx at the pharmacy for Flagyl. She isn't sure why this was sent in.

## 2019-10-10 NOTE — Telephone Encounter (Signed)
Left message to advise pt of results

## 2019-10-10 NOTE — Telephone Encounter (Signed)
I do not see anything in your note about BV.Marland Kitchen Please advise

## 2019-10-10 NOTE — Telephone Encounter (Signed)
Lengthy message sent to pt on MyChart, but perhaps she did not see that.  BV diagnosed by PAP, so Flagyl was sent to pharmacy and message to pt to start taking to help alleviate any symptoms or concerns from this.

## 2019-10-16 DIAGNOSIS — M542 Cervicalgia: Secondary | ICD-10-CM | POA: Diagnosis not present

## 2019-10-16 DIAGNOSIS — G43019 Migraine without aura, intractable, without status migrainosus: Secondary | ICD-10-CM | POA: Diagnosis not present

## 2019-10-16 DIAGNOSIS — R519 Headache, unspecified: Secondary | ICD-10-CM | POA: Diagnosis not present

## 2019-10-16 DIAGNOSIS — G4733 Obstructive sleep apnea (adult) (pediatric): Secondary | ICD-10-CM | POA: Diagnosis not present

## 2019-10-28 ENCOUNTER — Ambulatory Visit: Payer: 59 | Attending: Internal Medicine

## 2019-10-28 ENCOUNTER — Other Ambulatory Visit: Payer: Self-pay

## 2019-10-28 DIAGNOSIS — M25512 Pain in left shoulder: Secondary | ICD-10-CM | POA: Diagnosis not present

## 2019-10-28 DIAGNOSIS — M25511 Pain in right shoulder: Secondary | ICD-10-CM

## 2019-10-28 DIAGNOSIS — M25612 Stiffness of left shoulder, not elsewhere classified: Secondary | ICD-10-CM | POA: Diagnosis not present

## 2019-10-28 DIAGNOSIS — M25611 Stiffness of right shoulder, not elsewhere classified: Secondary | ICD-10-CM

## 2019-10-28 NOTE — Therapy (Signed)
[facility] [facility] [address] [place], [address] Phone: [phone]   Fax:  [phone]  Physical Therapy Evaluation  Patient Details  Name: Olivia Sanchez MRN: [id number] Date of Birth: [birth year] Referring Provider (PT): Marlowe Sax MD   Encounter Date: 10/28/2019   PT End of Session - 10/28/19 1015    Visit Number 1    Number of Visits 13    Date for PT Re-Evaluation 12/09/19    PT Start Time 0800    PT Stop Time 0900    PT Time Calculation (min) 60 min    Activity Tolerance Patient tolerated treatment well    Behavior During Therapy [facility] for tasks assessed/performed           Past Medical History:  Diagnosis Date  . Arthritis   . Depression   . Esophageal reflux   . Family history of adverse reaction to anesthesia    maternal aunt and maternal uncle and aunts daughter have malignant hyperthermia  . Fibroids   . Glaucoma   . Herpes genitalia   . Hypercholesteremia   . Hypertension   . Malignant hyperthermia    HUGE FAMILY HISTORY OF MH-PTS MATERNAL AUNT AND UNCLE AND COUSIN-PT HAS NEVER HAD ANY ISSUES BUT SHE HAS ONLY HAD COLONOSCOPY AND EGD  . Thyroid disease     Past Surgical History:  Procedure Laterality Date  . COLONOSCOPY  02/2014   Dr.Elliott  . CYSTOSCOPY    . EAR CYST EXCISION Left 04/21/2019   Procedure: EXCISION OF CYST FROM LEFT KNEE;  Surgeon: Dereck Leep, MD;  Location: [facility] ORS;  Service: Orthopedics;  Laterality: Left;  . ESOPHAGOGASTRODUODENOSCOPY      There were no vitals filed for this visit.    Subjective Assessment - 10/28/19 0934    Subjective Patient reports increased R shoulder pain that began in April of 2021. Patient states insidious onset with pain at is worsened with reaching overhead and behind her back on the R side. Patient states the pain in the shoulder is anterior. Patient reports decreased pain with use of ice/heat, rest and the use of  NSAID medication. Patient states she had a cortizone injection which helped her pain considerably and has been having a lot less pain since the injection. Patient is concerned about the injection going away. Patient states she has been improving but continues to have difficulty with reaching for high items off a shelf.    Pertinent History Cortizone injection: June 2021; PMH: carpal tunnel on the R    Limitations Lifting    Patient Stated Goals To decrease pain    Currently in Pain? No/denies    Pain Score 0-No pain   7/10   Pain Location Shoulder    Pain Orientation Right    Pain Descriptors / Indicators Aching    Pain Type Acute pain;Chronic pain    Pain Onset More than a month ago    Pain Frequency Intermittent              [facility] PT Assessment - 10/28/19 1107      Assessment   Medical Diagnosis R shoulder pain    Referring Provider (PT) Marlowe Sax MD    Onset Date/Surgical Date 06/26/19    Hand Dominance Right    Next MD Visit unknown     Prior Therapy yes      Balance Screen   Has the patient fallen in the past 6 months No  Has the patient had a decrease in activity level because of a fear of falling?  No    Is the patient reluctant to leave their home because of a fear of falling?  No      Prior Function   Level of Independence Independent    Vocation Full time employment    Curator, moving, works as an Therapist, sports    Leisure "nothing"       Cognition   Overall Cognitive Status Within Functional Limits for tasks assessed      Observation/Other Assessments   Observations Places UE in open-pack position to alleviate pain      Sensation   Light Touch Appears Intact      Posture/Postural Control   Posture/Postural Control --   FHP     ROM / Strength   AROM / PROM / Strength AROM;Strength      AROM   AROM Assessment Site Shoulder    Right/Left Shoulder Left;Right    Right Shoulder Flexion 165 Degrees   pain   Right Shoulder  ABduction 150 Degrees   pain   Right Shoulder Internal Rotation 55 Degrees   pain   Right Shoulder External Rotation 50 Degrees   pain   Left Shoulder Flexion 165 Degrees    Left Shoulder ABduction 165 Degrees    Left Shoulder Internal Rotation 80 Degrees    Left Shoulder External Rotation 85 Degrees      Strength   Strength Assessment Site Shoulder;Elbow    Right/Left Shoulder Right;Left    Right Shoulder Flexion 4/5   pain   Right Shoulder ABduction 4/5   pain   Right Shoulder Internal Rotation 4/5   pain   Right Shoulder External Rotation 4/5   pain   Left Shoulder Flexion 5/5    Left Shoulder ABduction 5/5    Left Shoulder Internal Rotation 5/5    Left Shoulder External Rotation 5/5    Right/Left Elbow Left;Right    Right Elbow Flexion 4/5   pain   Right Elbow Extension 4/5    Left Elbow Flexion 5/5    Left Elbow Extension 5/5      Palpation   Palpation comment TTP: biceps tendon, subscap tendon      Special Tests    Special Tests Biceps/Labral Tests;Rotator Cuff Impingement    Rotator Cuff Impingment tests Painful Arc of Motion;Neer impingement test;Hawkins- Merrilyn Puma test    Biceps/Labral tests Other      Neer Impingement test    Findings Negative    Comments R      Hawkins-Kennedy test   Findings Negative    Comments R      Painful Arc of Motion   Findings Negative    Comments R      Pronated load test (SLAP)   Findings Not tested      other   Findings Positive    Side Right    Comment Biceps Load            Objective measurements completed on examination: See above findings.    TREATMENT Therapeutic Exercise Isometric shoulder IR in doorway -- x 10 3 sec holds Weight passes behind back -- x 10 with 1# Bicep curls in standing 1# -- x 15   Performed exercises in standing to decrease pain and spasms        PT Education - 10/28/19 0954    Education Details HEP: weight pass behind back, isometric shoulder IR, bicep curl; form/technique with  exercise    Person(s) Educated Patient    Methods Explanation;Demonstration;Handout    Comprehension Verbalized understanding;Returned demonstration            PT Short Term Goals - 10/28/19 1029      PT SHORT TERM GOAL #1   Title Patient will have a worst pain score of 5/10 to indicated significant improvement in L shoulder function.    Baseline 7/10 worst pain    Time 6    Period Weeks    Status New    Target Date 12/09/19             PT Long Term Goals - 10/28/19 1102      PT LONG TERM GOAL #1   Title Patient will have an Apply scratch test equal the unaffected side to indicate significant improvement with ability to don a brassiere.    Baseline R behind back: (T7); behind neck: (back of the head); L behind back: (T4); L behind neck:(T5)    Time 6    Period Weeks    Status New    Target Date 11/08/19      PT LONG TERM GOAL #2   Title Patient will be independent with HEP to continue benefits of therapy after discharge    Baseline dependent with form/technique    Time 6    Period Weeks    Status New      PT LONG TERM GOAL #3   Title Patient will demonstrate signifcant improvement with FOTO score to indicate significant improvement with pain and spasms and be able to lift arm overhead with less pain.    Time 6    Period Weeks    Status New    Target Date 11/08/19      PT LONG TERM GOAL #4   Title Patient will have a worst pain of 2/10 to indicate signifcant improvement with overhead shoulder motion.    Baseline 7/10    Time 6    Period Weeks    Status New    Target Date 11/06/19                  Plan - 10/28/19 1019    Clinical Impression Statement Patient is a 53 yo right hand dominant female presenting with increased L shoulder pain with insidious onset in April of 2021. Patient demosntrates L shoulder dysfunciton with possible subscapularis and long head biceps tendons invovlement as indicated by concordant pain upon palpation along the afected  tissue, resisted biceps flexion and resisted shoulder IR. Patient demonstrates decreased strength with performance of all shoulder motions on the R shoulder and decreased AROM with flexion, ER, and IR on the R. Patient will benefit from further skilled therapy to return to prior level of function.    Personal Factors and Comorbidities Comorbidity 2    Comorbidities HTN, carpal tunnel    Examination-Activity Limitations Lift    Stability/Clinical Decision Making Stable/Uncomplicated    Clinical Decision Making Moderate    Rehab Potential Good    PT Frequency 2x / week    PT Duration 6 weeks    PT Treatment/Interventions Therapeutic activities;Therapeutic exercise;Neuromuscular re-education;Electrical Stimulation;Iontophoresis 4mg /ml Dexamethasone;Cryotherapy;Canalith Repostioning;Moist Heat;Manual techniques;Dry needling;Passive range of motion;Patient/family education;Spinal Manipulations;Joint Manipulations    PT Next Visit Plan progress AROM and strength    PT Home Exercise Plan see education section    Consulted and Agree with Plan of Care Patient           Patient will benefit from skilled  therapeutic intervention in order to improve the following deficits and impairments:  Pain, Decreased coordination, Decreased mobility, Increased muscle spasms, Decreased range of motion, Decreased endurance, Decreased activity tolerance, Hypomobility, Decreased strength  Visit Diagnosis: Left shoulder pain, unspecified chronicity  Stiffness of left shoulder, not elsewhere classified     Problem List Patient Active Problem List   Diagnosis Date Noted  . CIN I (cervical intraepithelial neoplasia I) 09/30/2019  . Papanicolaou smear of cervix with atypical squamous cells cannot exclude high grade squamous intraepithelial lesion (ASC-H) 03/29/2018  . Fibroids 01/18/2017    Blythe Stanford, PT DPT 10/28/2019, 11:17 AM  [place] [facility] [address], [place], [address] Phone: [phone]   Fax:  [phone]  Name: Olivia Sanchez MRN: [id number] Date of Birth: 1965/11/08

## 2019-10-30 ENCOUNTER — Ambulatory Visit: Payer: 59

## 2019-11-04 ENCOUNTER — Other Ambulatory Visit: Payer: Self-pay

## 2019-11-04 ENCOUNTER — Ambulatory Visit: Payer: 59

## 2019-11-04 DIAGNOSIS — M25511 Pain in right shoulder: Secondary | ICD-10-CM

## 2019-11-04 DIAGNOSIS — M25611 Stiffness of right shoulder, not elsewhere classified: Secondary | ICD-10-CM

## 2019-11-04 DIAGNOSIS — M25512 Pain in left shoulder: Secondary | ICD-10-CM | POA: Diagnosis not present

## 2019-11-04 DIAGNOSIS — M25612 Stiffness of left shoulder, not elsewhere classified: Secondary | ICD-10-CM | POA: Diagnosis not present

## 2019-11-04 NOTE — Therapy (Signed)
[place] [facility] [address], [place], [address] Phone: [phone]   Fax:  [phone]  Physical Therapy Treatment  Patient Details  Name: Olivia Sanchez MRN: [id number] Date of Birth: 09-Jan-1966 Referring Provider (PT): Marlowe Sax MD   Encounter Date: 11/04/2019   PT End of Session - 11/04/19 0849    Visit Number 2    Number of Visits 13    Date for PT Re-Evaluation 12/09/19    PT Start Time 0845    PT Stop Time 0930    PT Time Calculation (min) 45 min    Activity Tolerance Patient tolerated treatment well    Behavior During Therapy [facility] for tasks assessed/performed           Past Medical History:  Diagnosis Date  . Arthritis   . Depression   . Esophageal reflux   . Family history of adverse reaction to anesthesia    maternal aunt and maternal uncle and aunts daughter have malignant hyperthermia  . Fibroids   . Glaucoma   . Herpes genitalia   . Hypercholesteremia   . Hypertension   . Malignant hyperthermia    HUGE FAMILY HISTORY OF MH-PTS MATERNAL AUNT AND UNCLE AND COUSIN-PT HAS NEVER HAD ANY ISSUES BUT SHE HAS ONLY HAD COLONOSCOPY AND EGD  . Thyroid disease     Past Surgical History:  Procedure Laterality Date  . COLONOSCOPY  02/2014   Dr.Elliott  . CYSTOSCOPY    . EAR CYST EXCISION Left 04/21/2019   Procedure: EXCISION OF CYST FROM LEFT KNEE;  Surgeon: Dereck Leep, MD;  Location: [facility] ORS;  Service: Orthopedics;  Laterality: Left;  . ESOPHAGOGASTRODUODENOSCOPY      There were no vitals filed for this visit.   Subjective Assessment - 11/04/19 0847    Subjective Patient reports that shoulder has been ok overall but last night her shoulder began burning.    Pertinent History Cortizone injection: June 2021; PMH: carpal tunnel on the R    Limitations Lifting    Patient Stated Goals To decrease pain    Currently in Pain? Yes    Pain Score 4     Pain Onset More than a  month ago           Therapeutic Exercise  -YTB IR -- 2x15  -Wall Slides - 2x15 -Isometric shoulder IR in doorway -- x10 3 sec holds -Weight passes behind back -- 2x10 with 2# -Bicep curls in standing 1# -- 2x15  -RTB Rows -- 2x15    Performed exercises in standing to decrease pain, increase ROM and strength    PT Education - 11/04/19 0848    Education Details form/technique for exercises    Person(s) Educated Patient    Methods Explanation;Demonstration    Comprehension Verbalized understanding;Returned demonstration            PT Short Term Goals - 10/28/19 1029      PT SHORT TERM GOAL #1   Title Patient will have a worst pain score of 5/10 to indicated significant improvement in L shoulder function.    Baseline 7/10 worst pain    Time 6    Period Weeks    Status New    Target Date 12/09/19             PT Long Term Goals - 10/28/19 1102      PT LONG TERM GOAL #1   Title Patient will have an Apply scratch test  equal the unaffected side to indicate significant improvement with ability to don a brassiere.    Baseline R behind back: (T7); behind neck: (back of the head); L behind back: (T4); L behind neck:(T5)    Time 6    Period Weeks    Status New    Target Date 11/08/19      PT LONG TERM GOAL #2   Title Patient will be independent with HEP to continue benefits of therapy after discharge    Baseline dependent with form/technique    Time 6    Period Weeks    Status New      PT LONG TERM GOAL #3   Title Patient will demonstrate signifcant improvement with FOTO score to indicate significant improvement with pain and spasms and be able to lift arm overhead with less pain.    Time 6    Period Weeks    Status New    Target Date 11/08/19      PT LONG TERM GOAL #4   Title Patient will have a worst pain of 2/10 to indicate signifcant improvement with overhead shoulder motion.    Baseline 7/10    Time 6    Period Weeks    Status New    Target Date 11/06/19                  Plan - 11/04/19 0850    Clinical Impression Statement Reviewed HEP at beginning of the session at which patient showed compliance with isometric but not with passing the weight or biceps curl. She continues to have limited shoulder ROM and pulling sensation in anterior shoulder along with pain during isometrics.  Patient was not able to get hand to lumbar spine when passing weight behind back and had a pulling sensation in anterior shoulder when performing biceps curl. Patient tolerated strengthening exercises well without an increase in pain. Patients shoulder flexion is still painful near end range and was cued to stay below where pain begins.  Patient will continue to benefit from skilled therapy to return to prior level of function.    Personal Factors and Comorbidities Comorbidity 2    Comorbidities HTN, carpal tunnel    Examination-Activity Limitations Lift    Stability/Clinical Decision Making Stable/Uncomplicated    Clinical Decision Making Moderate    Rehab Potential Good    PT Frequency 2x / week    PT Duration 6 weeks    PT Treatment/Interventions Therapeutic activities;Therapeutic exercise;Neuromuscular re-education;Electrical Stimulation;Iontophoresis 4mg /ml Dexamethasone;Cryotherapy;Canalith Repostioning;Moist Heat;Manual techniques;Dry needling;Passive range of motion;Patient/family education;Spinal Manipulations;Joint Manipulations    PT Next Visit Plan progress AROM and strength    PT Home Exercise Plan see education section    Consulted and Agree with Plan of Care Patient           Patient will benefit from skilled therapeutic intervention in order to improve the following deficits and impairments:  Pain, Decreased coordination, Decreased mobility, Increased muscle spasms, Decreased range of motion, Decreased endurance, Decreased activity tolerance, Hypomobility, Decreased strength  Visit Diagnosis: Left shoulder pain, unspecified  chronicity  Stiffness of left shoulder, not elsewhere classified     Problem List Patient Active Problem List   Diagnosis Date Noted  . CIN I (cervical intraepithelial neoplasia I) 09/30/2019  . Papanicolaou smear of cervix with atypical squamous cells cannot exclude high grade squamous intraepithelial lesion (ASC-H) 03/29/2018  . Fibroids 01/18/2017   9:30 AM, 11/04/19 Margarito Liner, SPT Student Physical Therapist [facility]  [phone]  Margarito Liner 11/04/2019,  9:19 AM  [place] [facility] [address], [place], [address] Phone: [phone]   Fax:  [phone]  Name: Olivia Sanchez MRN: [id number] Date of Birth: 11-10-1965

## 2019-11-06 ENCOUNTER — Other Ambulatory Visit: Payer: Self-pay

## 2019-11-06 ENCOUNTER — Ambulatory Visit: Payer: 59

## 2019-11-06 DIAGNOSIS — M25612 Stiffness of left shoulder, not elsewhere classified: Secondary | ICD-10-CM

## 2019-11-06 DIAGNOSIS — M25511 Pain in right shoulder: Secondary | ICD-10-CM

## 2019-11-06 DIAGNOSIS — M25512 Pain in left shoulder: Secondary | ICD-10-CM | POA: Diagnosis not present

## 2019-11-06 DIAGNOSIS — M25611 Stiffness of right shoulder, not elsewhere classified: Secondary | ICD-10-CM

## 2019-11-06 NOTE — Therapy (Signed)
[place] [facility] [address], [place], [address] Phone: [phone]   Fax:  [phone]  Physical Therapy Treatment  Patient Details  Name: Olivia Sanchez MRN: [id number] Date of Birth: [birth year] Referring Provider (PT): Marlowe Sax MD   Encounter Date: 11/06/2019   PT End of Session - 11/06/19 0905    Visit Number 3    Number of Visits 13    Date for PT Re-Evaluation 12/09/19    PT Start Time 0900    PT Stop Time 0945    PT Time Calculation (min) 45 min    Activity Tolerance Patient tolerated treatment well    Behavior During Therapy [facility] for tasks assessed/performed           Past Medical History:  Diagnosis Date  . Arthritis   . Depression   . Esophageal reflux   . Family history of adverse reaction to anesthesia    maternal aunt and maternal uncle and aunts daughter have malignant hyperthermia  . Fibroids   . Glaucoma   . Herpes genitalia   . Hypercholesteremia   . Hypertension   . Malignant hyperthermia    HUGE FAMILY HISTORY OF MH-PTS MATERNAL AUNT AND UNCLE AND COUSIN-PT HAS NEVER HAD ANY ISSUES BUT SHE HAS ONLY HAD COLONOSCOPY AND EGD  . Thyroid disease     Past Surgical History:  Procedure Laterality Date  . COLONOSCOPY  02/2014   Dr.Elliott  . CYSTOSCOPY    . EAR CYST EXCISION Left 04/21/2019   Procedure: EXCISION OF CYST FROM LEFT KNEE;  Surgeon: Dereck Leep, MD;  Location: [facility] ORS;  Service: Orthopedics;  Laterality: Left;  . ESOPHAGOGASTRODUODENOSCOPY      There were no vitals filed for this visit.   Subjective Assessment - 11/06/19 0903    Subjective Patient reports anterior shoulder pain after last session.    Pertinent History Cortizone injection: June 2021; PMH: carpal tunnel on the R    Limitations Lifting    Patient Stated Goals To decrease pain    Currently in Pain? Yes    Pain Score 5     Pain Location Shoulder    Pain Onset More than a month ago            INTERVENTION    Therapeutic Exercise  -YTB IR -- 2x15  -Wall Slides - 2x15 -Weight passes behind back -- 2x10 with 3# -Bicep curls in standing 2# -- 2x15  -RTB Rows -- 2x15  -Pulleys x20    Performed exercises in standing to decrease pain, increase ROM and strength     PT Education - 11/06/19 0904    Education Details form/technique for exercises    Person(s) Educated Patient    Methods Explanation;Demonstration    Comprehension Verbalized understanding;Returned demonstration            PT Short Term Goals - 10/28/19 1029      PT SHORT TERM GOAL #1   Title Patient will have a worst pain score of 5/10 to indicated significant improvement in L shoulder function.    Baseline 7/10 worst pain    Time 6    Period Weeks    Status New    Target Date 12/09/19             PT Long Term Goals - 10/28/19 1102      PT LONG TERM GOAL #1   Title Patient will have an Apply scratch test equal the unaffected  side to indicate significant improvement with ability to don a brassiere.    Baseline R behind back: (T7); behind neck: (back of the head); L behind back: (T4); L behind neck:(T5)    Time 6    Period Weeks    Status New    Target Date 11/08/19      PT LONG TERM GOAL #2   Title Patient will be independent with HEP to continue benefits of therapy after discharge    Baseline dependent with form/technique    Time 6    Period Weeks    Status New      PT LONG TERM GOAL #3   Title Patient will demonstrate signifcant improvement with FOTO score to indicate significant improvement with pain and spasms and be able to lift arm overhead with less pain.    Time 6    Period Weeks    Status New    Target Date 11/08/19      PT LONG TERM GOAL #4   Title Patient will have a worst pain of 2/10 to indicate signifcant improvement with overhead shoulder motion.    Baseline 7/10    Time 6    Period Weeks    Status New    Target Date 11/06/19                  Plan - 11/06/19 0905    Clinical Impression Statement Addressed patients anterior shoulder pain near long head of biceps at beginning of session at which STM did not make much of an impact in decreasing pain. Patient continued to feel a pulling sensation in anterior shoulder during today's session with exercises.  Patients shoulder flexion continues to be limited due to pain around mid-range of movement and patients reports that "it feel as if her muscle will not go any further."  Patient responded well pulleys and was able to get full shoulder flexion ROM.  Patient will continue to benefit from skilled therapy to return to prior level of function.    Personal Factors and Comorbidities Comorbidity 2    Comorbidities HTN, carpal tunnel    Examination-Activity Limitations Lift    Stability/Clinical Decision Making Stable/Uncomplicated    Clinical Decision Making Moderate    Rehab Potential Good    PT Frequency 2x / week    PT Duration 6 weeks    PT Treatment/Interventions Therapeutic activities;Therapeutic exercise;Neuromuscular re-education;Electrical Stimulation;Iontophoresis 4mg /ml Dexamethasone;Cryotherapy;Canalith Repostioning;Moist Heat;Manual techniques;Dry needling;Passive range of motion;Patient/family education;Spinal Manipulations;Joint Manipulations    PT Next Visit Plan progress AROM and strength    PT Home Exercise Plan see education section    Consulted and Agree with Plan of Care Patient           Patient will benefit from skilled therapeutic intervention in order to improve the following deficits and impairments:  Pain, Decreased coordination, Decreased mobility, Increased muscle spasms, Decreased range of motion, Decreased endurance, Decreased activity tolerance, Hypomobility, Decreased strength  Visit Diagnosis: Left shoulder pain, unspecified chronicity  Stiffness of left shoulder, not elsewhere classified     Problem List Patient Active Problem List   Diagnosis Date  Noted  . CIN I (cervical intraepithelial neoplasia I) 09/30/2019  . Papanicolaou smear of cervix with atypical squamous cells cannot exclude high grade squamous intraepithelial lesion (ASC-H) 03/29/2018  . Fibroids 01/18/2017   9:46 AM, 11/06/19 Margarito Liner, SPT Student Physical Therapist [place]  [phone]  Margarito Liner 11/06/2019, 9:45 AM  [place] [facility] [address] [place], [place], [address] Phone: [phone]   Fax:  [phone]  Name: Olivia Sanchez MRN: [id number] Date of Birth: [birth year]

## 2019-11-11 ENCOUNTER — Other Ambulatory Visit: Payer: Self-pay

## 2019-11-11 ENCOUNTER — Ambulatory Visit: Payer: 59

## 2019-11-11 DIAGNOSIS — M25511 Pain in right shoulder: Secondary | ICD-10-CM

## 2019-11-11 DIAGNOSIS — M25612 Stiffness of left shoulder, not elsewhere classified: Secondary | ICD-10-CM | POA: Diagnosis not present

## 2019-11-11 DIAGNOSIS — M25512 Pain in left shoulder: Secondary | ICD-10-CM

## 2019-11-11 DIAGNOSIS — M25611 Stiffness of right shoulder, not elsewhere classified: Secondary | ICD-10-CM

## 2019-11-11 NOTE — Therapy (Signed)
[facility] [place] [address] [place], [address] Phone: [phone]   Fax:  [phone]  Physical Therapy Treatment  Patient Details  Name: Olivia Sanchez MRN: [id number] Date of Birth: 1965-11-20 Referring Provider (PT): Marlowe Sax MD   Encounter Date: 11/11/2019   PT End of Session - 11/11/19 0759    Visit Number 4    Number of Visits 13    Date for PT Re-Evaluation 12/09/19    PT Start Time 0852    PT Stop Time 0915    PT Time Calculation (min) 23 min    Activity Tolerance Patient tolerated treatment well    Behavior During Therapy [facility] for tasks assessed/performed           Past Medical History:  Diagnosis Date  . Arthritis   . Depression   . Esophageal reflux   . Family history of adverse reaction to anesthesia    maternal aunt and maternal uncle and aunts daughter have malignant hyperthermia  . Fibroids   . Glaucoma   . Herpes genitalia   . Hypercholesteremia   . Hypertension   . Malignant hyperthermia    HUGE FAMILY HISTORY OF MH-PTS MATERNAL AUNT AND UNCLE AND COUSIN-PT HAS NEVER HAD ANY ISSUES BUT SHE HAS ONLY HAD COLONOSCOPY AND EGD  . Thyroid disease     Past Surgical History:  Procedure Laterality Date  . COLONOSCOPY  02/2014   Dr.Elliott  . CYSTOSCOPY    . EAR CYST EXCISION Left 04/21/2019   Procedure: EXCISION OF CYST FROM LEFT KNEE;  Surgeon: Dereck Leep, MD;  Location: [facility] ORS;  Service: Orthopedics;  Laterality: Left;  . ESOPHAGOGASTRODUODENOSCOPY      There were no vitals filed for this visit.   Subjective Assessment - 11/11/19 0756    Subjective Patient reports she conitnues to have pain with lifting arm overhead.    Pertinent History Cortizone injection: June 2021; PMH: carpal tunnel on the R    Limitations Lifting    Patient Stated Goals To decrease pain    Currently in Pain? Yes    Pain Score 5     Pain Location Shoulder    Pain Orientation Right     Pain Descriptors / Indicators Aching    Pain Type Acute pain;Chronic pain    Pain Onset More than a month ago    Pain Frequency Intermittent             INTERVENTION     Therapeutic Exercise  -Bicep curls in standing 2# -- x20, 3# -- x20 - Shoulder biceps stretch in doorway - 7 sec  x 10 -Weight passes behind back - x20 with 3# - Shoulder IR in standing with RTB -- x 20    Performed exercises in standing to decrease pain, increase ROM and strength    PT Education - 11/11/19 0759    Education Details form/technique with exercise    Person(s) Educated Patient    Methods Explanation;Demonstration    Comprehension Verbalized understanding;Returned demonstration            PT Short Term Goals - 10/28/19 1029      PT SHORT TERM GOAL #1   Title Patient will have a worst pain score of 5/10 to indicated significant improvement in L shoulder function.    Baseline 7/10 worst pain    Time 6    Period Weeks    Status New    Target Date 12/09/19  PT Long Term Goals - 10/28/19 1102      PT LONG TERM GOAL #1   Title Patient will have an Apply scratch test equal the unaffected side to indicate significant improvement with ability to don a brassiere.    Baseline R behind back: (T7); behind neck: (back of the head); L behind back: (T4); L behind neck:(T5)    Time 6    Period Weeks    Status New    Target Date 11/08/19      PT LONG TERM GOAL #2   Title Patient will be independent with HEP to continue benefits of therapy after discharge    Baseline dependent with form/technique    Time 6    Period Weeks    Status New      PT LONG TERM GOAL #3   Title Patient will demonstrate signifcant improvement with FOTO score to indicate significant improvement with pain and spasms and be able to lift arm overhead with less pain.    Time 6    Period Weeks    Status New    Target Date 11/08/19      PT LONG TERM GOAL #4   Title Patient will have a worst pain of 2/10  to indicate signifcant improvement with overhead shoulder motion.    Baseline 7/10    Time 6    Period Weeks    Status New    Target Date 11/06/19                 Plan - 11/11/19 0805    Clinical Impression Statement Shortened session today secondary to tardiness from work. Performed exercises primarily targeting the biceps tendon and supraspinatus tendon, patient able to tolerate greater amount of resistance compared to previous sessions. Patient will beenfit from further skilled therapy focused on improving this to return to prior level of function.    Personal Factors and Comorbidities Comorbidity 2    Comorbidities HTN, carpal tunnel    Examination-Activity Limitations Lift    Stability/Clinical Decision Making Stable/Uncomplicated    Rehab Potential Good    PT Frequency 2x / week    PT Duration 6 weeks    PT Treatment/Interventions Therapeutic activities;Therapeutic exercise;Neuromuscular re-education;Electrical Stimulation;Iontophoresis 4mg /ml Dexamethasone;Cryotherapy;Canalith Repostioning;Moist Heat;Manual techniques;Dry needling;Passive range of motion;Patient/family education;Spinal Manipulations;Joint Manipulations    PT Next Visit Plan progress AROM and strength    PT Home Exercise Plan see education section    Consulted and Agree with Plan of Care Patient           Patient will benefit from skilled therapeutic intervention in order to improve the following deficits and impairments:  Pain, Decreased coordination, Decreased mobility, Increased muscle spasms, Decreased range of motion, Decreased endurance, Decreased activity tolerance, Hypomobility, Decreased strength  Visit Diagnosis: Left shoulder pain, unspecified chronicity  Stiffness of left shoulder, not elsewhere classified     Problem List Patient Active Problem List   Diagnosis Date Noted  . CIN I (cervical intraepithelial neoplasia I) 09/30/2019  . Papanicolaou smear of cervix with atypical squamous  cells cannot exclude high grade squamous intraepithelial lesion (ASC-H) 03/29/2018  . Fibroids 01/18/2017    Blythe Stanford, PT DPT 11/11/2019, 9:23 AM  [place] [facility] [address] [place], [address] Phone: [phone]   Fax:  [phone]  Name: Olivia Sanchez MRN: [id number] Date of Birth: 1965/06/30

## 2019-11-13 ENCOUNTER — Ambulatory Visit: Payer: 59

## 2019-11-18 ENCOUNTER — Other Ambulatory Visit: Payer: Self-pay

## 2019-11-18 ENCOUNTER — Ambulatory Visit: Payer: 59

## 2019-11-18 DIAGNOSIS — M25512 Pain in left shoulder: Secondary | ICD-10-CM | POA: Diagnosis not present

## 2019-11-18 DIAGNOSIS — M25511 Pain in right shoulder: Secondary | ICD-10-CM

## 2019-11-18 DIAGNOSIS — M25611 Stiffness of right shoulder, not elsewhere classified: Secondary | ICD-10-CM

## 2019-11-18 DIAGNOSIS — M25612 Stiffness of left shoulder, not elsewhere classified: Secondary | ICD-10-CM | POA: Diagnosis not present

## 2019-11-18 NOTE — Therapy (Signed)
[place] [facility] [address] [place], [address] Phone: [phone]   Fax:  [phone]  Physical Therapy Treatment  Patient Details  Name: Olivia Sanchez MRN: [id number] Date of Birth: May 06, 1965 Referring Provider (PT): Marlowe Sax MD   Encounter Date: 11/18/2019   PT End of Session - 11/18/19 0747    Visit Number 5    Number of Visits 13    Date for PT Re-Evaluation 12/09/19    PT Start Time 0745    PT Stop Time 0815    PT Time Calculation (min) 30 min    Activity Tolerance Patient tolerated treatment well    Behavior During Therapy [facility] for tasks assessed/performed           Past Medical History:  Diagnosis Date  . Arthritis   . Depression   . Esophageal reflux   . Family history of adverse reaction to anesthesia    maternal aunt and maternal uncle and aunts daughter have malignant hyperthermia  . Fibroids   . Glaucoma   . Herpes genitalia   . Hypercholesteremia   . Hypertension   . Malignant hyperthermia    HUGE FAMILY HISTORY OF MH-PTS MATERNAL AUNT AND UNCLE AND COUSIN-PT HAS NEVER HAD ANY ISSUES BUT SHE HAS ONLY HAD COLONOSCOPY AND EGD  . Thyroid disease     Past Surgical History:  Procedure Laterality Date  . COLONOSCOPY  02/2014   Dr.Elliott  . CYSTOSCOPY    . EAR CYST EXCISION Left 04/21/2019   Procedure: EXCISION OF CYST FROM LEFT KNEE;  Surgeon: Dereck Leep, MD;  Location: [facility] ORS;  Service: Orthopedics;  Laterality: Left;  . ESOPHAGOGASTRODUODENOSCOPY      There were no vitals filed for this visit.   Subjective Assessment - 11/18/19 0746    Subjective Patient reports she does not have pain at todays session but did not have to move people at work today.    Pertinent History Cortizone injection: June 2021; PMH: carpal tunnel on the R    Limitations Lifting    Patient Stated Goals To decrease pain    Currently in Pain? No/denies    Pain Onset More than a month  ago          INTERVENTION    Therapeutic Exercise  -YTB IR -- 2x15  -Wall Slides - 2x15 -Bicep curls in standing 3# -- 2x10 (focusing on eccentric 3 sec down) -Omega Rows - x12 10lbs  -Pulleys x20    Performed exercises in standing to decrease pain, increase ROM and strength      PT Education - 11/18/19 0747    Education Details form/technique with exercise    Person(s) Educated Patient    Methods Explanation;Demonstration    Comprehension Verbalized understanding;Returned demonstration            PT Short Term Goals - 10/28/19 1029      PT SHORT TERM GOAL #1   Title Patient will have a worst pain score of 5/10 to indicated significant improvement in L shoulder function.    Baseline 7/10 worst pain    Time 6    Period Weeks    Status New    Target Date 12/09/19             PT Long Term Goals - 10/28/19 1102      PT LONG TERM GOAL #1   Title Patient will have an Apply scratch test equal the unaffected side to indicate  significant improvement with ability to don a brassiere.    Baseline R behind back: (T7); behind neck: (back of the head); L behind back: (T4); L behind neck:(T5)    Time 6    Period Weeks    Status New    Target Date 11/08/19      PT LONG TERM GOAL #2   Title Patient will be independent with HEP to continue benefits of therapy after discharge    Baseline dependent with form/technique    Time 6    Period Weeks    Status New      PT LONG TERM GOAL #3   Title Patient will demonstrate signifcant improvement with FOTO score to indicate significant improvement with pain and spasms and be able to lift arm overhead with less pain.    Time 6    Period Weeks    Status New    Target Date 11/08/19      PT LONG TERM GOAL #4   Title Patient will have a worst pain of 2/10 to indicate signifcant improvement with overhead shoulder motion.    Baseline 7/10    Time 6    Period Weeks    Status New    Target Date 11/06/19                  Plan - 11/18/19 0748    Clinical Impression Statement Shortened session today due to tardiness. Worked on improving patient strength and ROM during today's session at which she was able to get a greater ROM during wall slide and pulley exercise but is still limited to full shoulder flexion AROM. Patient will benefit from further skilled therapy focused on improving this to return to prior level of function.    Personal Factors and Comorbidities Comorbidity 2    Comorbidities HTN, carpal tunnel    Examination-Activity Limitations Lift    Stability/Clinical Decision Making Stable/Uncomplicated    Clinical Decision Making Moderate    Rehab Potential Good    PT Frequency 2x / week    PT Duration 6 weeks    PT Treatment/Interventions Therapeutic activities;Therapeutic exercise;Neuromuscular re-education;Electrical Stimulation;Iontophoresis 4mg /ml Dexamethasone;Cryotherapy;Canalith Repostioning;Moist Heat;Manual techniques;Dry needling;Passive range of motion;Patient/family education;Spinal Manipulations;Joint Manipulations    PT Next Visit Plan progress AROM and strength    PT Home Exercise Plan see education section    Consulted and Agree with Plan of Care Patient           Patient will benefit from skilled therapeutic intervention in order to improve the following deficits and impairments:  Pain, Decreased coordination, Decreased mobility, Increased muscle spasms, Decreased range of motion, Decreased endurance, Decreased activity tolerance, Hypomobility, Decreased strength  Visit Diagnosis: Stiffness of left shoulder, not elsewhere classified  Left shoulder pain, unspecified chronicity     Problem List Patient Active Problem List   Diagnosis Date Noted  . CIN I (cervical intraepithelial neoplasia I) 09/30/2019  . Papanicolaou smear of cervix with atypical squamous cells cannot exclude high grade squamous intraepithelial lesion (ASC-H) 03/29/2018  . Fibroids 01/18/2017   8:18 AM,  11/18/19 Margarito Liner, SPT Student Physical Therapist [place]  [phone]  Margarito Liner 11/18/2019, 8:08 AM  [place] [facility] [address], [place], [address] Phone: [phone]   Fax:  [phone]  Name: Olivia Sanchez MRN: [id number] Date of Birth: [birth year]

## 2019-11-20 ENCOUNTER — Ambulatory Visit: Payer: 59

## 2019-11-20 ENCOUNTER — Other Ambulatory Visit: Payer: Self-pay

## 2019-11-20 DIAGNOSIS — M25611 Stiffness of right shoulder, not elsewhere classified: Secondary | ICD-10-CM

## 2019-11-20 DIAGNOSIS — M25612 Stiffness of left shoulder, not elsewhere classified: Secondary | ICD-10-CM | POA: Diagnosis not present

## 2019-11-20 DIAGNOSIS — M25511 Pain in right shoulder: Secondary | ICD-10-CM

## 2019-11-20 DIAGNOSIS — M25512 Pain in left shoulder: Secondary | ICD-10-CM | POA: Diagnosis not present

## 2019-11-20 NOTE — Therapy (Signed)
[place] [facility] [address] [place], [address] Phone: [phone]   Fax:  [phone]  Physical Therapy Treatment  Patient Details  Name: Olivia Sanchez MRN: [id number] Date of Birth: Sep 18, 1965 Referring Provider (PT): Marlowe Sax MD   Encounter Date: 11/20/2019   PT End of Session - 11/20/19 0950    Visit Number 6    Number of Visits 13    Date for PT Re-Evaluation 12/09/19    PT Start Time 0945    PT Stop Time 1030    PT Time Calculation (min) 45 min    Activity Tolerance Patient tolerated treatment well    Behavior During Therapy [facility] for tasks assessed/performed           Past Medical History:  Diagnosis Date  . Arthritis   . Depression   . Esophageal reflux   . Family history of adverse reaction to anesthesia    maternal aunt and maternal uncle and aunts daughter have malignant hyperthermia  . Fibroids   . Glaucoma   . Herpes genitalia   . Hypercholesteremia   . Hypertension   . Malignant hyperthermia    HUGE FAMILY HISTORY OF MH-PTS MATERNAL AUNT AND UNCLE AND COUSIN-PT HAS NEVER HAD ANY ISSUES BUT SHE HAS ONLY HAD COLONOSCOPY AND EGD  . Thyroid disease     Past Surgical History:  Procedure Laterality Date  . COLONOSCOPY  02/2014   Dr.Elliott  . CYSTOSCOPY    . EAR CYST EXCISION Left 04/21/2019   Procedure: EXCISION OF CYST FROM LEFT KNEE;  Surgeon: Dereck Leep, MD;  Location: [facility] ORS;  Service: Orthopedics;  Laterality: Left;  . ESOPHAGOGASTRODUODENOSCOPY      There were no vitals filed for this visit.   Subjective Assessment - 11/20/19 0949    Subjective Patient reports her shoulder felt tired after last session but was not sore.    Pertinent History Cortizone injection: June 2021; PMH: carpal tunnel on the R    Limitations Lifting    Patient Stated Goals To decrease pain    Currently in Pain? Yes    Pain Score 2     Pain Onset More than a month ago            INTERVENTION    Therapeutic Exercise  -YTB IR -- 2x15  -Wall Slides - 2x15 -Pec Stretch in doorway - 3x20sec  -Bicep curls in standing 3# -- 2x10 (focusing on eccentric 3 sec down) -Omega Machine Rows - 2x12 10lbs  -Pulleys x20    Performed exercises in standing to decrease pain, increase ROM and strength     PT Education - 11/20/19 0949    Education Details form/ecthnique with exercises    Person(s) Educated Patient    Methods Explanation;Demonstration    Comprehension Verbalized understanding;Returned demonstration            PT Short Term Goals - 10/28/19 1029      PT SHORT TERM GOAL #1   Title Patient will have a worst pain score of 5/10 to indicated significant improvement in L shoulder function.    Baseline 7/10 worst pain    Time 6    Period Weeks    Status New    Target Date 12/09/19             PT Long Term Goals - 10/28/19 1102      PT LONG TERM GOAL #1   Title Patient will have an Apply  scratch test equal the unaffected side to indicate significant improvement with ability to don a brassiere.    Baseline R behind back: (T7); behind neck: (back of the head); L behind back: (T4); L behind neck:(T5)    Time 6    Period Weeks    Status New    Target Date 11/08/19      PT LONG TERM GOAL #2   Title Patient will be independent with HEP to continue benefits of therapy after discharge    Baseline dependent with form/technique    Time 6    Period Weeks    Status New      PT LONG TERM GOAL #3   Title Patient will demonstrate signifcant improvement with FOTO score to indicate significant improvement with pain and spasms and be able to lift arm overhead with less pain.    Time 6    Period Weeks    Status New    Target Date 11/08/19      PT LONG TERM GOAL #4   Title Patient will have a worst pain of 2/10 to indicate signifcant improvement with overhead shoulder motion.    Baseline 7/10    Time 6    Period Weeks    Status New    Target Date  11/06/19                 Plan - 11/20/19 0953    Clinical Impression Statement Continued to address patient strength and ROM during today's session at which patient tolerated well without an increase in symptoms. Patients ROM is improving demonstrated by increase in shoulder flexion AROM to 163 degrees with no pain.  Patient also was able to tolerate an increase in exercise volume with mild muscular fatigue. Instructed patient to add pec stretch to HEP due to patient responding well to stretch during session.  Patient will benefit from skilled therapy to return to prior level of function.    Personal Factors and Comorbidities Comorbidity 2    Comorbidities HTN, carpal tunnel    Examination-Activity Limitations Lift    Stability/Clinical Decision Making Stable/Uncomplicated    Clinical Decision Making Moderate    Rehab Potential Good    PT Frequency 2x / week    PT Duration 6 weeks    PT Treatment/Interventions Therapeutic activities;Therapeutic exercise;Neuromuscular re-education;Electrical Stimulation;Iontophoresis 4mg /ml Dexamethasone;Cryotherapy;Canalith Repostioning;Moist Heat;Manual techniques;Dry needling;Passive range of motion;Patient/family education;Spinal Manipulations;Joint Manipulations    PT Next Visit Plan progress AROM and strength    PT Home Exercise Plan see education section    Consulted and Agree with Plan of Care Patient           Patient will benefit from skilled therapeutic intervention in order to improve the following deficits and impairments:  Pain, Decreased coordination, Decreased mobility, Increased muscle spasms, Decreased range of motion, Decreased endurance, Decreased activity tolerance, Hypomobility, Decreased strength  Visit Diagnosis: Stiffness of left shoulder, not elsewhere classified  Left shoulder pain, unspecified chronicity     Problem List Patient Active Problem List   Diagnosis Date Noted  . CIN I (cervical intraepithelial  neoplasia I) 09/30/2019  . Papanicolaou smear of cervix with atypical squamous cells cannot exclude high grade squamous intraepithelial lesion (ASC-H) 03/29/2018  . Fibroids 01/18/2017   10:30 AM, 11/20/19 Margarito Liner, SPT Student Physical Therapist [place]  [phone]  Margarito Liner 11/20/2019, 10:24 AM  [place] [facility] [address], [place], [address] Phone: [phone]   Fax:  [phone]  Name: Olivia Sanchez MRN: [id number] Date of Birth: 1965-07-31

## 2019-11-25 ENCOUNTER — Ambulatory Visit: Payer: 59

## 2019-11-27 ENCOUNTER — Other Ambulatory Visit: Payer: Self-pay

## 2019-11-27 ENCOUNTER — Ambulatory Visit: Payer: 59 | Attending: Internal Medicine

## 2019-11-27 DIAGNOSIS — F341 Dysthymic disorder: Secondary | ICD-10-CM | POA: Diagnosis not present

## 2019-11-27 DIAGNOSIS — M25612 Stiffness of left shoulder, not elsewhere classified: Secondary | ICD-10-CM | POA: Diagnosis not present

## 2019-11-27 DIAGNOSIS — M25611 Stiffness of right shoulder, not elsewhere classified: Secondary | ICD-10-CM

## 2019-11-27 DIAGNOSIS — H401131 Primary open-angle glaucoma, bilateral, mild stage: Secondary | ICD-10-CM | POA: Diagnosis not present

## 2019-11-27 DIAGNOSIS — M25512 Pain in left shoulder: Secondary | ICD-10-CM | POA: Insufficient documentation

## 2019-11-27 DIAGNOSIS — M25511 Pain in right shoulder: Secondary | ICD-10-CM

## 2019-11-27 NOTE — Therapy (Signed)
[place] [facility] [address] [place], [address] Phone: [phone]   Fax:  [phone]  Physical Therapy Treatment  Patient Details  Name: Olivia Sanchez MRN: [id number] Date of Birth: 06-07-1965 Referring Provider (PT): Marlowe Sax MD   Encounter Date: 11/27/2019   PT End of Session - 11/27/19 1032    Visit Number 7    Number of Visits 13    Date for PT Re-Evaluation 12/09/19    PT Start Time 1030    PT Stop Time 1115    PT Time Calculation (min) 45 min    Activity Tolerance Patient tolerated treatment well    Behavior During Therapy [facility] for tasks assessed/performed           Past Medical History:  Diagnosis Date  . Arthritis   . Depression   . Esophageal reflux   . Family history of adverse reaction to anesthesia    maternal aunt and maternal uncle and aunts daughter have malignant hyperthermia  . Fibroids   . Glaucoma   . Herpes genitalia   . Hypercholesteremia   . Hypertension   . Malignant hyperthermia    HUGE FAMILY HISTORY OF MH-PTS MATERNAL AUNT AND UNCLE AND COUSIN-PT HAS NEVER HAD ANY ISSUES BUT SHE HAS ONLY HAD COLONOSCOPY AND EGD  . Thyroid disease     Past Surgical History:  Procedure Laterality Date  . COLONOSCOPY  02/2014   Dr.Elliott  . CYSTOSCOPY    . EAR CYST EXCISION Left 04/21/2019   Procedure: EXCISION OF CYST FROM LEFT KNEE;  Surgeon: Dereck Leep, MD;  Location: [facility] ORS;  Service: Orthopedics;  Laterality: Left;  . ESOPHAGOGASTRODUODENOSCOPY      There were no vitals filed for this visit.   Subjective Assessment - 11/27/19 1029    Subjective Patient reports her shoulder felt good during her recent stretch of work days.    Pertinent History Cortizone injection: June 2021; PMH: carpal tunnel on the R    Limitations Lifting    Patient Stated Goals To decrease pain    Currently in Pain? No/denies    Pain Onset More than a month ago            INTERVENTION    Therapeutic Exercise  -RTB IR -- 2x15  -Wall Slides - 2x15 -Standing Shoulder Flexion 2lbs B 2x10 -Standing Shoulder Abduction 2lbs B 2x10 -Pec Stretch in doorway - 3x20sec  -Bicep curls in standing 3# -- 2x10 (focusing on eccentric 3 sec down) -Omega Machine Rows - 2x12 10lbs  -Pulleys x20    Performed exercises in standing to decrease pain, increase ROM and strength     PT Education - 11/27/19 1031    Education Details form/technique with exercises    Person(s) Educated Patient    Methods Explanation;Demonstration    Comprehension Verbalized understanding;Returned demonstration            PT Short Term Goals - 10/28/19 1029      PT SHORT TERM GOAL #1   Title Patient will have a worst pain score of 5/10 to indicated significant improvement in L shoulder function.    Baseline 7/10 worst pain    Time 6    Period Weeks    Status New    Target Date 12/09/19             PT Long Term Goals - 11/27/19 1119      PT LONG TERM GOAL #1   Title  Patient will have an Apply scratch test equal the unaffected side to indicate significant improvement with ability to don a brassiere.    Baseline R behind back: (T7); behind neck: (back of the head); L behind back: (T4); L behind neck:(T5)    Time 6    Period Weeks    Status New      PT LONG TERM GOAL #2   Title Patient will be independent with HEP to continue benefits of therapy after discharge    Baseline dependent with form/technique    Time 6    Period Weeks    Status New      PT LONG TERM GOAL #3   Title Patient will demonstrate signifcant improvement with FOTO score to 60 to indicate significant improvement with pain and spasms and be able to lift arm overhead with less pain.    Baseline 30    Time 6    Period Weeks    Status New      PT LONG TERM GOAL #4   Title Patient will have a worst pain of 2/10 to indicate signifcant improvement with overhead shoulder motion.    Baseline 7/10    Time 6     Period Weeks    Status New                 Plan - 11/27/19 1118    Clinical Impression Statement Continued to work on improving patients shoulder strength and ROM during today's session.  Patient tolerated exercises well with minimum increase in symptoms which occurred only while performing shoulder abduction and quickly subsided after.  Patient was able to complete shoulder exercises with more ROM and less/no pain during those exercises.  Patient will continue to benefit from skilled therapy to address limitations remaining and return to prior level of function.    Personal Factors and Comorbidities Comorbidity 2    Comorbidities HTN, carpal tunnel    Examination-Activity Limitations Lift    Stability/Clinical Decision Making Stable/Uncomplicated    Clinical Decision Making Moderate    Rehab Potential Good    PT Frequency 2x / week    PT Duration 6 weeks    PT Treatment/Interventions Therapeutic activities;Therapeutic exercise;Neuromuscular re-education;Electrical Stimulation;Iontophoresis 4mg /ml Dexamethasone;Cryotherapy;Canalith Repostioning;Moist Heat;Manual techniques;Dry needling;Passive range of motion;Patient/family education;Spinal Manipulations;Joint Manipulations    PT Next Visit Plan progress AROM and strength    PT Home Exercise Plan see education section    Consulted and Agree with Plan of Care Patient           Patient will benefit from skilled therapeutic intervention in order to improve the following deficits and impairments:  Pain, Decreased coordination, Decreased mobility, Increased muscle spasms, Decreased range of motion, Decreased endurance, Decreased activity tolerance, Hypomobility, Decreased strength  Visit Diagnosis: Stiffness of left shoulder, not elsewhere classified  Left shoulder pain, unspecified chronicity     Problem List Patient Active Problem List   Diagnosis Date Noted  . CIN I (cervical intraepithelial neoplasia I) 09/30/2019  .  Papanicolaou smear of cervix with atypical squamous cells cannot exclude high grade squamous intraepithelial lesion (ASC-H) 03/29/2018  . Fibroids 01/18/2017   11:21 AM, 11/27/19 Margarito Liner, SPT Student Physical Therapist [place]  [phone]  Margarito Liner 11/27/2019, 11:20 AM  [place] [facility] [address], [place], [address] Phone: [phone]   Fax:  [phone]  Name: Olivia Sanchez MRN: [id number] Date of Birth: [birth year]

## 2019-12-02 ENCOUNTER — Ambulatory Visit: Payer: 59

## 2019-12-03 ENCOUNTER — Other Ambulatory Visit: Payer: Self-pay | Admitting: Internal Medicine

## 2019-12-03 DIAGNOSIS — E7800 Pure hypercholesterolemia, unspecified: Secondary | ICD-10-CM | POA: Diagnosis not present

## 2019-12-03 DIAGNOSIS — I10 Essential (primary) hypertension: Secondary | ICD-10-CM | POA: Diagnosis not present

## 2019-12-04 ENCOUNTER — Ambulatory Visit: Payer: 59

## 2019-12-09 ENCOUNTER — Other Ambulatory Visit: Payer: Self-pay

## 2019-12-09 ENCOUNTER — Ambulatory Visit: Payer: 59

## 2019-12-09 DIAGNOSIS — M25612 Stiffness of left shoulder, not elsewhere classified: Secondary | ICD-10-CM | POA: Diagnosis not present

## 2019-12-09 DIAGNOSIS — M25512 Pain in left shoulder: Secondary | ICD-10-CM | POA: Diagnosis not present

## 2019-12-09 DIAGNOSIS — M25611 Stiffness of right shoulder, not elsewhere classified: Secondary | ICD-10-CM

## 2019-12-09 DIAGNOSIS — M25511 Pain in right shoulder: Secondary | ICD-10-CM

## 2019-12-09 NOTE — Therapy (Signed)
[facility] [address] [place], [address] Phone: [phone]   Fax:  [phone]  Physical Therapy Treatment  Patient Details  Name: Olivia Sanchez MRN: [id number] Date of Birth: [birth year] Referring Provider (PT): Marlowe Sax MD   Encounter Date: 12/09/2019   PT End of Session - 12/09/19 0904    Visit Number 8    Number of Visits 13    Date for PT Re-Evaluation 12/09/19    PT Start Time 0900    PT Stop Time 0945    PT Time Calculation (min) 45 min    Activity Tolerance Patient tolerated treatment well    Behavior During Therapy [facility] for tasks assessed/performed           Past Medical History:  Diagnosis Date  . Arthritis   . Depression   . Esophageal reflux   . Family history of adverse reaction to anesthesia    maternal aunt and maternal uncle and aunts daughter have malignant hyperthermia  . Fibroids   . Glaucoma   . Herpes genitalia   . Hypercholesteremia   . Hypertension   . Malignant hyperthermia    HUGE FAMILY HISTORY OF MH-PTS MATERNAL AUNT AND UNCLE AND COUSIN-PT HAS NEVER HAD ANY ISSUES BUT SHE HAS ONLY HAD COLONOSCOPY AND EGD  . Thyroid disease     Past Surgical History:  Procedure Laterality Date  . COLONOSCOPY  02/2014   Dr.Elliott  . CYSTOSCOPY    . EAR CYST EXCISION Left 04/21/2019   Procedure: EXCISION OF CYST FROM LEFT KNEE;  Surgeon: Dereck Leep, MD;  Location: [facility] ORS;  Service: Orthopedics;  Laterality: Left;  . ESOPHAGOGASTRODUODENOSCOPY      There were no vitals filed for this visit.   Subjective Assessment - 12/09/19 0905    Subjective Patient reports her shoulder had some pain over the weekend due to work related tasks.    Pertinent History Cortizone injection: June 2021; PMH: carpal tunnel on the R    Limitations Lifting    Patient Stated Goals To decrease pain    Currently in Pain? No/denies    Pain Onset More than a month ago           INTERVENTION   Manual Therapy -Inferior mobs to R shoulder 3x30 -A-P mobs of right shoulder 3x30   Therapeutic Exercise  -RTB IR -- 2x15  -Standing Shoulder Flexion 2lbs B 2x10 -Standing Shoulder Abduction 2lbs B 2x10 -Pec Stretch in doorway - 3x30sec  -Bicep curls in standing 4# -- 2x10 (focusing on eccentric 3 sec down) -Omega Machine Rows - 2x12 10lbs  -Pulleys x3 minutes    Performed exercises in standing to decrease pain, increase ROM and strength    PT Education - 12/09/19 0903    Education Details form/technique with exercises    Person(s) Educated Patient    Methods Explanation;Demonstration    Comprehension Verbalized understanding;Returned demonstration            PT Short Term Goals - 10/28/19 1029      PT SHORT TERM GOAL #1   Title Patient will have a worst pain score of 5/10 to indicated significant improvement in L shoulder function.    Baseline 7/10 worst pain    Time 6    Period Weeks    Status New    Target Date 12/09/19             PT Long Term Goals - 11/27/19 1119  PT LONG TERM GOAL #1   Title Patient will have an Apply scratch test equal the unaffected side to indicate significant improvement with ability to don a brassiere.    Baseline R behind back: (T7); behind neck: (back of the head); L behind back: (T4); L behind neck:(T5)    Time 6    Period Weeks    Status New      PT LONG TERM GOAL #2   Title Patient will be independent with HEP to continue benefits of therapy after discharge    Baseline dependent with form/technique    Time 6    Period Weeks    Status New      PT LONG TERM GOAL #3   Title Patient will demonstrate signifcant improvement with FOTO score to 60 to indicate significant improvement with pain and spasms and be able to lift arm overhead with less pain.    Baseline 30    Time 6    Period Weeks    Status New      PT LONG TERM GOAL #4   Title Patient will have a worst pain of 2/10 to indicate signifcant  improvement with overhead shoulder motion.    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  Focused on improving pain free ROM and strength during today's session. Patient had moderate pain with mid-range motions of shoulder today at which mobilizations helped to reduce the pain in session.  Patient will continue to benefit from skilled therapy to return to prior level of function.    Personal Factors and Comorbidities Comorbidity 2    Comorbidities HTN, carpal tunnel    Examination-Activity Limitations Lift    Stability/Clinical Decision Making Stable/Uncomplicated    Clinical Decision Making Moderate    Rehab Potential Good    PT Frequency 2x / week    PT Duration 6 weeks    PT Treatment/Interventions Therapeutic activities;Therapeutic exercise;Neuromuscular re-education;Electrical Stimulation;Iontophoresis 4mg /ml Dexamethasone;Cryotherapy;Canalith Repostioning;Moist Heat;Manual techniques;Dry needling;Passive range of motion;Patient/family education;Spinal Manipulations;Joint Manipulations    PT Next Visit Plan progress AROM and strength    PT Home Exercise Plan see education section    Consulted and Agree with Plan of Care Patient           Patient will benefit from skilled therapeutic intervention in order to improve the following deficits and impairments:  Pain, Decreased coordination, Decreased mobility, Increased muscle spasms, Decreased range of motion, Decreased endurance, Decreased activity tolerance, Hypomobility, Decreased strength  Visit Diagnosis: Stiffness of left shoulder, not elsewhere classified  Left shoulder pain, unspecified chronicity     Problem List Patient Active Problem List   Diagnosis Date Noted  . CIN I (cervical  intraepithelial neoplasia I) 09/30/2019  . Papanicolaou smear of cervix with atypical squamous cells cannot exclude high grade squamous intraepithelial lesion (ASC-H) 03/29/2018  . Fibroids 01/18/2017   9:49 AM, 12/09/19 Margarito Liner, SPT Student Physical Therapist [place]  [phone]  Margarito Liner 12/09/2019, 9:48 AM  [facility] [address], [place], [address] Phone: [phone]   Fax:  [phone]  Name: Olivia Sanchez  MRN: [id number] Date of Birth: 04/08/1965

## 2019-12-11 ENCOUNTER — Ambulatory Visit: Payer: 59

## 2019-12-16 ENCOUNTER — Ambulatory Visit: Payer: 59

## 2019-12-16 ENCOUNTER — Other Ambulatory Visit: Payer: Self-pay

## 2019-12-16 DIAGNOSIS — M25612 Stiffness of left shoulder, not elsewhere classified: Secondary | ICD-10-CM | POA: Diagnosis not present

## 2019-12-16 DIAGNOSIS — M25512 Pain in left shoulder: Secondary | ICD-10-CM | POA: Diagnosis not present

## 2019-12-16 DIAGNOSIS — M25511 Pain in right shoulder: Secondary | ICD-10-CM

## 2019-12-16 DIAGNOSIS — M25611 Stiffness of right shoulder, not elsewhere classified: Secondary | ICD-10-CM

## 2019-12-16 NOTE — Therapy (Signed)
[facility] [address], [place], [address] Phone: [phone]   Fax:  [phone]  Physical Therapy Treatment  Patient Details  Name: Olivia Sanchez MRN: [id number] Date of Birth: [birth year] Referring Provider (PT): Marlowe Sax MD   Encounter Date: 12/16/2019   PT End of Session - 12/16/19 0911    Visit Number 9    Number of Visits 13    Date for PT Re-Evaluation 12/09/19    PT Start Time 0908    PT Stop Time 0948    PT Time Calculation (min) 40 min    Activity Tolerance Patient tolerated treatment well    Behavior During Therapy [facility] for tasks assessed/performed           Past Medical History:  Diagnosis Date   Arthritis    Depression    Esophageal reflux    Family history of adverse reaction to anesthesia    maternal aunt and maternal uncle and aunts daughter have malignant hyperthermia   Fibroids    Glaucoma    Herpes genitalia    Hypercholesteremia    Hypertension    Malignant hyperthermia    HUGE FAMILY HISTORY OF MH-PTS MATERNAL AUNT AND UNCLE AND COUSIN-PT HAS NEVER HAD ANY ISSUES BUT SHE HAS ONLY HAD COLONOSCOPY AND EGD   Thyroid disease     Past Surgical History:  Procedure Laterality Date   COLONOSCOPY  02/2014   Dr.Elliott   CYSTOSCOPY     EAR CYST EXCISION Left 04/21/2019   Procedure: EXCISION OF CYST FROM LEFT KNEE;  Surgeon: Dereck Leep, MD;  Location: [facility];  Service: Orthopedics;  Laterality: Left;   ESOPHAGOGASTRODUODENOSCOPY      There were no vitals filed for this visit.   Subjective Assessment - 12/16/19 0910    Subjective Patient reports her shoulder is bothing her and she had pain last night when trying to sleep.    Pertinent History Cortizone injection: June 2021; PMH: carpal tunnel on the R    Limitations Lifting    Patient Stated Goals To decrease pain    Currently in Pain? No/denies    Pain Onset More than a month ago           INTERVENTION    Manual Therapy -Inferior mobs to R shoulder 3x30 -A-P mobs of right shoulder 3x30   Therapeutic Exercise  -RTB IR -- 2x15  -Standing Shoulder Flexion 1lbs B 2x15 (scaption)  -Standing Shoulder Abduction 1lbs B 2x15 -Pec Stretch in doorway - 3x30sec  -Omega Machine Rows - 2x12 10lbs  -Pulleys x3 minutes    Performed exercises in standing to decrease pain, increase ROM and strength    PT Education - 12/16/19 0910    Education Details form/technique with exercises    Person(s) Educated Patient    Methods Explanation;Demonstration    Comprehension Verbalized understanding;Returned demonstration            PT Short Term Goals - 10/28/19 1029      PT SHORT TERM GOAL #1   Title Patient will have a worst pain score of 5/10 to indicated significant improvement in L shoulder function.    Baseline 7/10 worst pain    Time 6    Period Weeks    Status New    Target Date 12/09/19             PT Long Term Goals - 11/27/19 1119      PT LONG TERM GOAL #  1   Title Patient will have an Apply scratch test equal the unaffected side to indicate significant improvement with ability to don a brassiere.    Baseline R behind back: (T7); behind neck: (back of the head); L behind back: (T4); L behind neck:(T5)    Time 6    Period Weeks    Status New      PT LONG TERM GOAL #2   Title Patient will be independent with HEP to continue benefits of therapy after discharge    Baseline dependent with form/technique    Time 6    Period Weeks    Status New      PT LONG TERM GOAL #3   Title Patient will demonstrate signifcant improvement with FOTO score to 60 to indicate significant improvement with pain and spasms and be able to lift arm overhead with less pain.    Baseline 30    Time 6    Period Weeks    Status New      PT LONG TERM GOAL #4   Title Patient will have a worst pain of 2/10 to indicate signifcant improvement with overhead shoulder motion.    Baseline  7/10    Time 6    Period Weeks    Status New                 Plan - 12/16/19 0954    Clinical Impression Statement Continued to address patients pain at beginning of session through manual therapy which helped reduced pain. Patient remains limited with shoulder flexion and abduction due to pain.  Patient able to complete shoulder flexion in scaption plane without pain; however, continues to have pain during mid-range movements.  Patients symptoms are more intense after work compared to when she has not worked the night before. Patient will continue to benefit from skilled therapy to return to prior level of function.    Personal Factors and Comorbidities Comorbidity 2    Comorbidities HTN, carpal tunnel    Examination-Activity Limitations Lift    Stability/Clinical Decision Making Stable/Uncomplicated    Clinical Decision Making Moderate    Rehab Potential Good    PT Frequency 2x / week    PT Duration 6 weeks    PT Treatment/Interventions Therapeutic activities;Therapeutic exercise;Neuromuscular re-education;Electrical Stimulation;Iontophoresis 4mg /ml Dexamethasone;Cryotherapy;Canalith Repostioning;Moist Heat;Manual techniques;Dry needling;Passive range of motion;Patient/family education;Spinal Manipulations;Joint Manipulations    PT Next Visit Plan progress AROM and strength    PT Home Exercise Plan see education section    Consulted and Agree with Plan of Care Patient           Patient will benefit from skilled therapeutic intervention in order to improve the following deficits and impairments:  Pain, Decreased coordination, Decreased mobility, Increased muscle spasms, Decreased range of motion, Decreased endurance, Decreased activity tolerance, Hypomobility, Decreased strength  Visit Diagnosis: Stiffness of left shoulder, not elsewhere classified  Left shoulder pain, unspecified chronicity     Problem List Patient Active Problem List   Diagnosis Date Noted   CIN I  (cervical intraepithelial neoplasia I) 09/30/2019   Papanicolaou smear of cervix with atypical squamous cells cannot exclude high grade squamous intraepithelial lesion (ASC-H) 03/29/2018   Fibroids 01/18/2017   9:56 AM, 12/16/19 Margarito Liner, SPT Student Physical Therapist [place]  Margarito Liner 12/16/2019, 9:55 AM  [facility] [address], [place], [address] Phone: [phone]   Fax:  [phone]  Name: Olivia Sanchez MRN:  [id number] Date of Birth: 1965/07/09

## 2019-12-18 ENCOUNTER — Other Ambulatory Visit: Payer: Self-pay

## 2019-12-18 ENCOUNTER — Ambulatory Visit: Payer: 59

## 2019-12-18 DIAGNOSIS — M25512 Pain in left shoulder: Secondary | ICD-10-CM | POA: Diagnosis not present

## 2019-12-18 DIAGNOSIS — M25612 Stiffness of left shoulder, not elsewhere classified: Secondary | ICD-10-CM | POA: Diagnosis not present

## 2019-12-18 DIAGNOSIS — M25511 Pain in right shoulder: Secondary | ICD-10-CM

## 2019-12-18 DIAGNOSIS — M25611 Stiffness of right shoulder, not elsewhere classified: Secondary | ICD-10-CM

## 2019-12-18 DIAGNOSIS — F341 Dysthymic disorder: Secondary | ICD-10-CM | POA: Diagnosis not present

## 2019-12-18 NOTE — Therapy (Signed)
[facility] [address] [place], [address] Phone: [phone]   Fax:  [phone]  Physical Therapy Treatment/Progress Note  Patient Details  Name: Olivia Sanchez MRN: [id number] Date of Birth: 12-18-1965 Referring Provider (PT): Marlowe Sax MD   Encounter Date: 12/18/2019   PT End of Session - 12/18/19 0900    Visit Number 10    Number of Visits 13    Date for PT Re-Evaluation 12/09/19    PT Start Time 0900    PT Stop Time 0945    PT Time Calculation (min) 45 min    Activity Tolerance Patient tolerated treatment well    Behavior During Therapy [facility] for tasks assessed/performed           Past Medical History:  Diagnosis Date   Arthritis    Depression    Esophageal reflux    Family history of adverse reaction to anesthesia    maternal aunt and maternal uncle and aunts daughter have malignant hyperthermia   Fibroids    Glaucoma    Herpes genitalia    Hypercholesteremia    Hypertension    Malignant hyperthermia    HUGE FAMILY HISTORY OF MH-PTS MATERNAL AUNT AND UNCLE AND COUSIN-PT HAS NEVER HAD ANY ISSUES BUT SHE HAS ONLY HAD COLONOSCOPY AND EGD   Thyroid disease     Past Surgical History:  Procedure Laterality Date   COLONOSCOPY  02/2014   Dr.Elliott   CYSTOSCOPY     EAR CYST EXCISION Left 04/21/2019   Procedure: EXCISION OF CYST FROM LEFT KNEE;  Surgeon: Dereck Leep, MD;  Location: [facility];  Service: Orthopedics;  Laterality: Left;   ESOPHAGOGASTRODUODENOSCOPY      There were no vitals filed for this visit.   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    Pertinent History Cortizone injection: June 2021; PMH: carpal tunnel on the R    Limitations Lifting    Patient Stated Goals To decrease pain    Currently in Pain? Yes    Pain Score 6     Pain  Onset More than a month ago           INTERVENTION     Therapeutic Exercise  -RTB IR -- x15  -RTB ER - x15  -Standing Shoulder Flexion 1lbs B 2x15 (scaption)  -Standing Shoulder Abduction 1lbs B 2x15 -Omega Machine Rows - 2x12 10lbs  -Pulleys x3 minutes    Performed exercises in standing to decrease pain, increase ROM and strength     PT Education - 12/18/19 0859    Education Details form/technique with exercises    Person(s) Educated Patient    Methods Explanation;Demonstration    Comprehension Verbalized understanding;Returned demonstration            PT Short Term Goals - 12/18/19 0910      PT SHORT TERM GOAL #1   Title Patient will have a worst pain score of 5/10 to indicated significant improvement in L shoulder function.    Baseline 7/10 worst pain; 12/18/19: 6/10    Time 6    Period Weeks    Status On-going    Target Date 12/09/19             PT Long Term Goals - 12/18/19 0900      PT LONG TERM GOAL #1   Title Patient  will have an Apply scratch test equal the unaffected side to indicate significant improvement with ability to don a brassiere.    Baseline R behind back: (T7); behind neck: (back of the head); L behind back: (T4); L behind neck:(T5) ;9/23: R BB: T6 L BB: T7, R BH: T3 L BH: T5    Time 6    Period Weeks    Status On-going      PT LONG TERM GOAL #2   Title Patient will be independent with HEP to continue benefits of therapy after discharge    Baseline dependent with form/technique    Time 6    Period Weeks    Status On-going      PT LONG TERM GOAL #3   Title Patient will demonstrate signifcant improvement with FOTO score to 60 to indicate significant improvement with pain and spasms and be able to lift arm overhead with less pain.    Baseline 30; 12/18/19: 62    Time 6    Period Weeks    Status Achieved      PT LONG TERM GOAL #4   Title Patient will have a worst pain of 2/10 to indicate signifcant improvement with overhead shoulder  motion.    Baseline 7/10; 12/18/19: 6/10    Time 6    Period Weeks    Status On-going                 Plan - 12/18/19 0935    Clinical Impression Statement Reassessed patients goals during todays session at which patient is showing improvement towards long-term goals.  Patient met her FOTO goal showing an increase in her ability to perform functional activities.  She also improved her IR demonstrated by being able to increase Apply Scratch Test behind back to T6 and her pain has slightly improved but remains increased due to work related activities that irritate patients shoulder.  Continued to work on increasing patient strength and ROM at which she tolerated well with no increase in symptoms.  Patient will continue to benefit from skilled therapy to return to prior level of function.    Personal Factors and Comorbidities Comorbidity 2    Comorbidities HTN, carpal tunnel    Examination-Activity Limitations Lift    Stability/Clinical Decision Making Stable/Uncomplicated    Clinical Decision Making Moderate    Rehab Potential Good    PT Frequency 2x / week    PT Duration 6 weeks    PT Treatment/Interventions Therapeutic activities;Therapeutic exercise;Neuromuscular re-education;Electrical Stimulation;Iontophoresis 35m/ml Dexamethasone;Cryotherapy;Canalith Repostioning;Moist Heat;Manual techniques;Dry needling;Passive range of motion;Patient/family education;Spinal Manipulations;Joint Manipulations    PT Next Visit Plan progress AROM and strength    PT Home Exercise Plan see education section    Consulted and Agree with Plan of Care Patient           Patient will benefit from skilled therapeutic intervention in order to improve the following deficits and impairments:  Pain, Decreased coordination, Decreased mobility, Increased muscle spasms, Decreased range of motion, Decreased endurance, Decreased activity tolerance, Hypomobility, Decreased strength  Visit Diagnosis: Stiffness  of left shoulder, not elsewhere classified  Left shoulder pain, unspecified chronicity     Problem List Patient Active Problem List   Diagnosis Date Noted   CIN I (cervical intraepithelial neoplasia I) 09/30/2019   Papanicolaou smear of cervix with atypical squamous cells cannot exclude high grade squamous intraepithelial lesion (ASC-H) 03/29/2018   Fibroids 01/18/2017   9:45 AM, 12/18/19 BMargarito Liner SPT Student Physical Therapist [place] [phone]  Margarito Liner 12/18/2019, 9:36 AM  [facility] [address] [place], [address] Phone: [phone]   Fax:  [phone]  Name: MARSELA KUAN MRN: [id number] Date of Birth: 1965-06-27

## 2019-12-23 ENCOUNTER — Other Ambulatory Visit: Payer: Self-pay | Admitting: Optometry

## 2019-12-23 ENCOUNTER — Other Ambulatory Visit: Payer: Self-pay

## 2019-12-23 ENCOUNTER — Ambulatory Visit: Payer: 59

## 2019-12-23 DIAGNOSIS — M25612 Stiffness of left shoulder, not elsewhere classified: Secondary | ICD-10-CM | POA: Diagnosis not present

## 2019-12-23 DIAGNOSIS — M25611 Stiffness of right shoulder, not elsewhere classified: Secondary | ICD-10-CM

## 2019-12-23 DIAGNOSIS — M25512 Pain in left shoulder: Secondary | ICD-10-CM | POA: Diagnosis not present

## 2019-12-23 DIAGNOSIS — M25511 Pain in right shoulder: Secondary | ICD-10-CM

## 2019-12-23 NOTE — Therapy (Signed)
[facility] [address], [place], [address] Phone: [phone]   Fax:  [phone]  Physical Therapy Treatment  Patient Details  Name: Olivia Sanchez MRN: [id number] Date of Birth: [birth year] Referring Provider (PT): Marlowe Sax MD   Encounter Date: 12/23/2019   PT End of Session - 12/23/19 0905    Visit Number 11    Number of Visits 13    Date for PT Re-Evaluation 12/09/19    PT Start Time 0905    PT Stop Time 0945    PT Time Calculation (min) 40 min    Activity Tolerance Patient tolerated treatment well    Behavior During Therapy [facility] for tasks assessed/performed           Past Medical History:  Diagnosis Date  . Arthritis   . Depression   . Esophageal reflux   . Family history of adverse reaction to anesthesia    maternal aunt and maternal uncle and aunts daughter have malignant hyperthermia  . Fibroids   . Glaucoma   . Herpes genitalia   . Hypercholesteremia   . Hypertension   . Malignant hyperthermia    HUGE FAMILY HISTORY OF MH-PTS MATERNAL AUNT AND UNCLE AND COUSIN-PT HAS NEVER HAD ANY ISSUES BUT SHE HAS ONLY HAD COLONOSCOPY AND EGD  . Thyroid disease     Past Surgical History:  Procedure Laterality Date  . COLONOSCOPY  02/2014   Dr.Elliott  . CYSTOSCOPY    . EAR CYST EXCISION Left 04/21/2019   Procedure: EXCISION OF CYST FROM LEFT KNEE;  Surgeon: Dereck Leep, MD;  Location: [facility] ORS;  Service: Orthopedics;  Laterality: Left;  . ESOPHAGOGASTRODUODENOSCOPY      There were no vitals filed for this visit.   Subjective Assessment - 12/23/19 0904    Subjective Patient reports she has pain and has not worked since last session.    Pertinent History Cortizone injection: June 2021; PMH: carpal tunnel on the R    Limitations Lifting    Patient Stated Goals To decrease pain    Currently in Pain? Yes    Pain Score 3     Pain Onset More than a month ago           INTERVENTION   Manual Therapy  -Grade III A-P Mobilization to R UE 3x30sec  -Grade III Inferior Mobilization to R UE 3x30sec     Therapeutic Exercise  -RTB IR -- x15  -RTB ER - x15  -Standing Shoulder Flexion 1lbs B 2x15 (scaption)  -Standing Shoulder Abduction 1lbs B 2x15 -Standing Omega Machine Rows - 2x12 10lbs  -Pulleys x4 minutes    Performed exercises in standing to decrease pain, increase ROM and strength          PT Education - 12/23/19 0905    Education Details form/technique with exercises    Person(s) Educated Patient    Methods Explanation;Demonstration    Comprehension Verbalized understanding;Returned demonstration            PT Short Term Goals - 12/18/19 0910      PT SHORT TERM GOAL #1   Title Patient will have a worst pain score of 5/10 to indicated significant improvement in L shoulder function.    Baseline 7/10 worst pain; 12/18/19: 6/10    Time 6    Period Weeks    Status On-going    Target Date 12/09/19  PT Long Term Goals - 12/18/19 0900      PT LONG TERM GOAL #1   Title Patient will have an Apply scratch test equal the unaffected side to indicate significant improvement with ability to don a brassiere.    Baseline R behind back: (T7); behind neck: (back of the head); L behind back: (T4); L behind neck:(T5) ;9/23: R BB: T6 L BB: T7, R BH: T3 L BH: T5    Time 6    Period Weeks    Status On-going      PT LONG TERM GOAL #2   Title Patient will be independent with HEP to continue benefits of therapy after discharge    Baseline dependent with form/technique    Time 6    Period Weeks    Status On-going      PT LONG TERM GOAL #3   Title Patient will demonstrate signifcant improvement with FOTO score to 60 to indicate significant improvement with pain and spasms and be able to lift arm overhead with less pain.    Baseline 30; 12/18/19: 62    Time 6    Period Weeks    Status Achieved      PT LONG TERM GOAL #4   Title  Patient will have a worst pain of 2/10 to indicate signifcant improvement with overhead shoulder motion.    Baseline 7/10; 12/18/19: 6/10    Time 6    Period Weeks    Status On-going                 Plan - 12/23/19 0944    Clinical Impression Statement Continued with current POC.  Able to decrease patient's pain through mobilizations which allowed patient perform exercises.  Mid-range pain remains during all shoulder movements and does not allow patient to achieve full ROM but is able to in scaption. Patient will benefit from skilled therapy to continue to address limitations and return to prior level of function.    Personal Factors and Comorbidities Comorbidity 2    Comorbidities HTN, carpal tunnel    Examination-Activity Limitations Lift    Stability/Clinical Decision Making Stable/Uncomplicated    Clinical Decision Making Moderate    Rehab Potential Good    PT Frequency 2x / week    PT Duration 6 weeks    PT Treatment/Interventions Therapeutic activities;Therapeutic exercise;Neuromuscular re-education;Electrical Stimulation;Iontophoresis 4mg /ml Dexamethasone;Cryotherapy;Canalith Repostioning;Moist Heat;Manual techniques;Dry needling;Passive range of motion;Patient/family education;Spinal Manipulations;Joint Manipulations    PT Next Visit Plan progress AROM and strength    PT Home Exercise Plan see education section    Consulted and Agree with Plan of Care Patient           Patient will benefit from skilled therapeutic intervention in order to improve the following deficits and impairments:  Pain, Decreased coordination, Decreased mobility, Increased muscle spasms, Decreased range of motion, Decreased endurance, Decreased activity tolerance, Hypomobility, Decreased strength  Visit Diagnosis: Stiffness of left shoulder, not elsewhere classified  Left shoulder pain, unspecified chronicity     Problem List Patient Active Problem List   Diagnosis Date Noted  . CIN I  (cervical intraepithelial neoplasia I) 09/30/2019  . Papanicolaou smear of cervix with atypical squamous cells cannot exclude high grade squamous intraepithelial lesion (ASC-H) 03/29/2018  . Fibroids 01/18/2017   9:45 AM, 12/23/19 Margarito Liner, SPT Student Physical Therapist [place]  [phone]  Margarito Liner 12/23/2019, 9:45 AM  [facility] [address], [place], [address] Phone: [phone]  Fax:  [phone]  Name: ILENE WITCHER MRN: [id number] Date of Birth: 03-Jul-1965

## 2019-12-25 ENCOUNTER — Other Ambulatory Visit: Payer: Self-pay

## 2019-12-25 ENCOUNTER — Ambulatory Visit: Payer: 59

## 2019-12-25 DIAGNOSIS — M25512 Pain in left shoulder: Secondary | ICD-10-CM | POA: Diagnosis not present

## 2019-12-25 DIAGNOSIS — M25611 Stiffness of right shoulder, not elsewhere classified: Secondary | ICD-10-CM

## 2019-12-25 DIAGNOSIS — M25612 Stiffness of left shoulder, not elsewhere classified: Secondary | ICD-10-CM | POA: Diagnosis not present

## 2019-12-25 DIAGNOSIS — M25511 Pain in right shoulder: Secondary | ICD-10-CM

## 2019-12-25 NOTE — Therapy (Signed)
[facility] [address] [place], [address] Phone: [phone]   Fax:  [phone]  Physical Therapy Treatment  Patient Details  Name: Olivia Sanchez MRN: [id number] Date of Birth: 1965/07/30 Referring Provider (PT): Marlowe Sax MD   Encounter Date: 12/25/2019   PT End of Session - 12/25/19 0817    Visit Number 12    Number of Visits 13    Date for PT Re-Evaluation 01/20/20    PT Start Time 0815    PT Stop Time 0900    PT Time Calculation (min) 45 min    Activity Tolerance Patient tolerated treatment well    Behavior During Therapy [facility] for tasks assessed/performed           Past Medical History:  Diagnosis Date  . Arthritis   . Depression   . Esophageal reflux   . Family history of adverse reaction to anesthesia    maternal aunt and maternal uncle and aunts daughter have malignant hyperthermia  . Fibroids   . Glaucoma   . Herpes genitalia   . Hypercholesteremia   . Hypertension   . Malignant hyperthermia    HUGE FAMILY HISTORY OF MH-PTS MATERNAL AUNT AND UNCLE AND COUSIN-PT HAS NEVER HAD ANY ISSUES BUT SHE HAS ONLY HAD COLONOSCOPY AND EGD  . Thyroid disease     Past Surgical History:  Procedure Laterality Date  . COLONOSCOPY  02/2014   Dr.Elliott  . CYSTOSCOPY    . EAR CYST EXCISION Left 04/21/2019   Procedure: EXCISION OF CYST FROM LEFT KNEE;  Surgeon: Dereck Leep, MD;  Location: [facility] ORS;  Service: Orthopedics;  Laterality: Left;  . ESOPHAGOGASTRODUODENOSCOPY      There were no vitals filed for this visit.   Subjective Assessment - 12/25/19 0815    Subjective Patient reports she had pain at work but it was not as intense as it has been.    Pertinent History Cortizone injection: June 2021; PMH: carpal tunnel on the R    Limitations Lifting    Patient Stated Goals To decrease pain    Currently in Pain? Yes    Pain Score 3     Pain Location Shoulder    Pain Onset  More than a month ago          INTERVENTION      Therapeutic Exercise  -RTB IR -- x15  -RTB ER - x15  -Standing Shoulder Flexion with PVC 3# 2x10  -Standing Shoulder Abduction 1lbs B 2x15 -Standing Omega Machine Rows - 2x12 10lbs  -Pulleys x4 minutes  -Push-up Plus 2x10   Performed exercises in standing to decrease pain, increase ROM and strength   PT Education - 12/25/19 0816    Education Details form/technique with exercises    Person(s) Educated Patient    Methods Explanation;Demonstration    Comprehension Verbalized understanding;Returned demonstration            PT Short Term Goals - 12/18/19 0910      PT SHORT TERM GOAL #1   Title Patient will have a worst pain score of 5/10 to indicated significant improvement in L shoulder function.    Baseline 7/10 worst pain; 12/18/19: 6/10    Time 6    Period Weeks    Status On-going    Target Date 12/09/19             PT Long Term Goals - 12/18/19 0900      PT LONG  TERM GOAL #1   Title Patient will have an Apply scratch test equal the unaffected side to indicate significant improvement with ability to don a brassiere.    Baseline R behind back: (T7); behind neck: (back of the head); L behind back: (T4); L behind neck:(T5) ;9/23: R BB: T6 L BB: T7, R BH: T3 L BH: T5    Time 6    Period Weeks    Status On-going      PT LONG TERM GOAL #2   Title Patient will be independent with HEP to continue benefits of therapy after discharge    Baseline dependent with form/technique    Time 6    Period Weeks    Status On-going      PT LONG TERM GOAL #3   Title Patient will demonstrate signifcant improvement with FOTO score to 60 to indicate significant improvement with pain and spasms and be able to lift arm overhead with less pain.    Baseline 30; 12/18/19: 62    Time 6    Period Weeks    Status Achieved      PT LONG TERM GOAL #4   Title Patient will have a worst pain of 2/10 to indicate signifcant improvement with  overhead shoulder motion.    Baseline 7/10; 12/18/19: 6/10    Time 6    Period Weeks    Status On-going                 Plan - 12/25/19 0818    Clinical Impression Statement Patient continues to have pain but has a greater ROM of motion before the onset of pain.  Patient's shoulder strength remains limited demonstrated by pain free PROM and AAROM with pain during shoulder flexion and abduction AROM. Assessed patients SA which caused concordant pain when resisted. Patient will continue to benefit from skilled therapy to strengthening shoulder musculature to address remaining deficits and return to prior level of function.    Personal Factors and Comorbidities Comorbidity 2    Comorbidities HTN, carpal tunnel    Examination-Activity Limitations Lift    Stability/Clinical Decision Making Stable/Uncomplicated    Clinical Decision Making Moderate    Rehab Potential Good    PT Frequency 2x / week    PT Duration 6 weeks    PT Treatment/Interventions Therapeutic activities;Therapeutic exercise;Neuromuscular re-education;Electrical Stimulation;Iontophoresis 4mg /ml Dexamethasone;Cryotherapy;Canalith Repostioning;Moist Heat;Manual techniques;Dry needling;Passive range of motion;Patient/family education;Spinal Manipulations;Joint Manipulations    PT Next Visit Plan progress AROM and strength    PT Home Exercise Plan see education section    Consulted and Agree with Plan of Care Patient           Patient will benefit from skilled therapeutic intervention in order to improve the following deficits and impairments:  Pain, Decreased coordination, Decreased mobility, Increased muscle spasms, Decreased range of motion, Decreased endurance, Decreased activity tolerance, Hypomobility, Decreased strength  Visit Diagnosis: Stiffness of left shoulder, not elsewhere classified  Left shoulder pain, unspecified chronicity     Problem List Patient Active Problem List   Diagnosis Date Noted  . CIN  I (cervical intraepithelial neoplasia I) 09/30/2019  . Papanicolaou smear of cervix with atypical squamous cells cannot exclude high grade squamous intraepithelial lesion (ASC-H) 03/29/2018  . Fibroids 01/18/2017   9:00 AM, 12/25/19 Margarito Liner, SPT Student Physical Therapist [place]  [phone]  Margarito Liner 12/25/2019, 8:51 AM  [facility] [address] [place], [address] Phone: [phone]  Fax:  [phone]  Name: SHAMICKA INGA MRN: [id number] Date of Birth: 1965/06/22

## 2020-01-01 ENCOUNTER — Ambulatory Visit: Payer: 59 | Attending: Internal Medicine

## 2020-01-01 ENCOUNTER — Other Ambulatory Visit: Payer: Self-pay

## 2020-01-01 DIAGNOSIS — M25611 Stiffness of right shoulder, not elsewhere classified: Secondary | ICD-10-CM | POA: Insufficient documentation

## 2020-01-01 DIAGNOSIS — M25612 Stiffness of left shoulder, not elsewhere classified: Secondary | ICD-10-CM | POA: Diagnosis not present

## 2020-01-01 DIAGNOSIS — M25511 Pain in right shoulder: Secondary | ICD-10-CM | POA: Insufficient documentation

## 2020-01-01 DIAGNOSIS — M25512 Pain in left shoulder: Secondary | ICD-10-CM | POA: Insufficient documentation

## 2020-01-01 NOTE — Therapy (Signed)
[facility] [address] [place], [address] Phone: [phone]   Fax:  [phone]  Physical Therapy Treatment  Patient Details  Name: Olivia Sanchez MRN: [id number] Date of Birth: 01-Apr-1965 Referring Provider (PT): Marlowe Sax MD   Encounter Date: 01/01/2020   PT End of Session - 01/01/20 0909    Visit Number 13    Number of Visits 13    Date for PT Re-Evaluation 01/20/20    PT Start Time 0905    PT Stop Time 0945    PT Time Calculation (min) 40 min    Activity Tolerance Patient tolerated treatment well    Behavior During Therapy [facility] for tasks assessed/performed           Past Medical History:  Diagnosis Date  . Arthritis   . Depression   . Esophageal reflux   . Family history of adverse reaction to anesthesia    maternal aunt and maternal uncle and aunts daughter have malignant hyperthermia  . Fibroids   . Glaucoma   . Herpes genitalia   . Hypercholesteremia   . Hypertension   . Malignant hyperthermia    HUGE FAMILY HISTORY OF MH-PTS MATERNAL AUNT AND UNCLE AND COUSIN-PT HAS NEVER HAD ANY ISSUES BUT SHE HAS ONLY HAD COLONOSCOPY AND EGD  . Thyroid disease     Past Surgical History:  Procedure Laterality Date  . COLONOSCOPY  02/2014   Dr.Elliott  . CYSTOSCOPY    . EAR CYST EXCISION Left 04/21/2019   Procedure: EXCISION OF CYST FROM LEFT KNEE;  Surgeon: Dereck Leep, MD;  Location: [facility] ORS;  Service: Orthopedics;  Laterality: Left;  . ESOPHAGOGASTRODUODENOSCOPY      There were no vitals filed for this visit.   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    Pertinent History Cortizone injection: June 2021; PMH: carpal tunnel on the R    Limitations Lifting    Patient Stated Goals To decrease pain     Currently in Pain? Yes    Pain Score 5     Pain Location Shoulder    Pain Onset More than a month ago          INTERVENTION   Manual Therapy  -A-P mobilization to R Shoulder 3x30 -Inferior mobilizations to R Shoulder 3x30      Therapeutic Exercise  -RTB IR -- x15  -Standing Shoulder Flexion in Scaption 2x15 1#  -Standing Omega Machine Rows - 2x12 10lbs  -Pulleys x4 minutes  -Push-up Plus 2x10   Performed exercises in standing to decrease pain, increase ROM and strength    PT Education - 01/01/20 0908    Education Details Form/technique with exercises    Person(s) Educated Patient    Methods Explanation;Demonstration    Comprehension Verbalized understanding;Returned demonstration            PT Short Term Goals - 12/18/19 0910      PT SHORT TERM GOAL #1   Title Patient will have a worst pain score of 5/10 to indicated significant improvement in L shoulder function.    Baseline 7/10 worst pain; 12/18/19: 6/10    Time 6    Period Weeks    Status On-going    Target Date 12/09/19             PT Long  Term Goals - 12/18/19 0900      PT LONG TERM GOAL #1   Title Patient will have an Apply scratch test equal the unaffected side to indicate significant improvement with ability to don a brassiere.    Baseline R behind back: (T7); behind neck: (back of the head); L behind back: (T4); L behind neck:(T5) ;9/23: R BB: T6 L BB: T7, R BH: T3 L BH: T5    Time 6    Period Weeks    Status On-going      PT LONG TERM GOAL #2   Title Patient will be independent with HEP to continue benefits of therapy after discharge    Baseline dependent with form/technique    Time 6    Period Weeks    Status On-going      PT LONG TERM GOAL #3   Title Patient will demonstrate signifcant improvement with FOTO score to 60 to indicate significant improvement with pain and spasms and be able to lift arm overhead with less pain.    Baseline 30; 12/18/19: 62    Time 6    Period Weeks    Status  Achieved      PT LONG TERM GOAL #4   Title Patient will have a worst pain of 2/10 to indicate signifcant improvement with overhead shoulder motion.    Baseline 7/10; 12/18/19: 6/10    Time 6    Period Weeks    Status On-going                 Plan - 01/01/20 0943    Clinical Impression Statement Performed manual therapy to patients R shoulder to help decrease pain.  Patient responded well to manual therapy.  Continued with AAROM before going into AROM exercises.  Patient was able to tolerate strengthening exercises with a mild increase in pain that subsided after ending exercises. Patient unable to perform abduction due to increase in pain, however, she was able to perform shoulder flexion in scaption.  Patient will continue to benefit from skilled therapy to address pain and return to prior level of function.    Personal Factors and Comorbidities Comorbidity 2    Comorbidities HTN, carpal tunnel    Examination-Activity Limitations Lift    Stability/Clinical Decision Making Stable/Uncomplicated    Clinical Decision Making Moderate    Rehab Potential Good    PT Frequency 2x / week    PT Duration 6 weeks    PT Treatment/Interventions Therapeutic activities;Therapeutic exercise;Neuromuscular re-education;Electrical Stimulation;Iontophoresis 4mg /ml Dexamethasone;Cryotherapy;Canalith Repostioning;Moist Heat;Manual techniques;Dry needling;Passive range of motion;Patient/family education;Spinal Manipulations;Joint Manipulations    PT Next Visit Plan progress AROM and strength    PT Home Exercise Plan see education section    Consulted and Agree with Plan of Care Patient           Patient will benefit from skilled therapeutic intervention in order to improve the following deficits and impairments:  Pain, Decreased coordination, Decreased mobility, Increased muscle spasms, Decreased range of motion, Decreased endurance, Decreased activity tolerance, Hypomobility, Decreased strength  Visit  Diagnosis: Stiffness of left shoulder, not elsewhere classified  Left shoulder pain, unspecified chronicity     Problem List Patient Active Problem List   Diagnosis Date Noted  . CIN I (cervical intraepithelial neoplasia I) 09/30/2019  . Papanicolaou smear of cervix with atypical squamous cells cannot exclude high grade squamous intraepithelial lesion (ASC-H) 03/29/2018  . Fibroids 01/18/2017   9:45 AM, 01/01/20 Margarito Liner, SPT Student Physical Therapist [place]  [phone]  Margarito Liner 01/01/2020, 9:44 AM  [facility] [address], [place], [address] Phone: [phone]   Fax:  [phone]  Name: Olivia Sanchez MRN: [id number] Date of Birth: 1965-07-23

## 2020-01-05 ENCOUNTER — Ambulatory Visit: Payer: 59

## 2020-01-15 ENCOUNTER — Other Ambulatory Visit: Payer: Self-pay | Admitting: Psychiatry

## 2020-01-15 ENCOUNTER — Ambulatory Visit: Payer: 59

## 2020-01-15 ENCOUNTER — Other Ambulatory Visit: Payer: Self-pay

## 2020-01-15 DIAGNOSIS — M25611 Stiffness of right shoulder, not elsewhere classified: Secondary | ICD-10-CM | POA: Diagnosis not present

## 2020-01-15 DIAGNOSIS — M25511 Pain in right shoulder: Secondary | ICD-10-CM

## 2020-01-15 DIAGNOSIS — M25612 Stiffness of left shoulder, not elsewhere classified: Secondary | ICD-10-CM

## 2020-01-15 DIAGNOSIS — M25512 Pain in left shoulder: Secondary | ICD-10-CM | POA: Diagnosis not present

## 2020-01-15 DIAGNOSIS — F341 Dysthymic disorder: Secondary | ICD-10-CM | POA: Diagnosis not present

## 2020-01-15 NOTE — Therapy (Signed)
[facility] [address] [place], [address] Phone: [phone]   Fax:  [phone]  Physical Therapy Treatment  Patient Details  Name: Olivia Sanchez MRN: [id number] Date of Birth: Apr 04, 1965 Referring Provider (PT): Marlowe Sax MD   Encounter Date: 01/15/2020   PT End of Session - 01/15/20 0912    Visit Number 14    Number of Visits 21    Date for PT Re-Evaluation 01/20/20    PT Start Time 0907    PT Stop Time 0945    PT Time Calculation (min) 38 min    Activity Tolerance Patient tolerated treatment well    Behavior During Therapy [facility] for tasks assessed/performed           Past Medical History:  Diagnosis Date  . Arthritis   . Depression   . Esophageal reflux   . Family history of adverse reaction to anesthesia    maternal aunt and maternal uncle and aunts daughter have malignant hyperthermia  . Fibroids   . Glaucoma   . Herpes genitalia   . Hypercholesteremia   . Hypertension   . Malignant hyperthermia    HUGE FAMILY HISTORY OF MH-PTS MATERNAL AUNT AND UNCLE AND COUSIN-PT HAS NEVER HAD ANY ISSUES BUT SHE HAS ONLY HAD COLONOSCOPY AND EGD  . Thyroid disease     Past Surgical History:  Procedure Laterality Date  . COLONOSCOPY  02/2014   Dr.Elliott  . CYSTOSCOPY    . EAR CYST EXCISION Left 04/21/2019   Procedure: EXCISION OF CYST FROM LEFT KNEE;  Surgeon: Dereck Leep, MD;  Location: [facility] ORS;  Service: Orthopedics;  Laterality: Left;  . ESOPHAGOGASTRODUODENOSCOPY      There were no vitals filed for this visit.   Subjective Assessment - 01/15/20 0908    Subjective Patient reports that she recently hurt her shoulder after moving furniture.    Pertinent History Cortizone injection: June 2021; PMH: carpal tunnel on the R    Limitations Lifting    Patient Stated Goals To decrease pain    Currently in Pain? Yes    Pain Score 2     Pain Location Shoulder    Pain Onset More  than a month ago          INTERVENTION    Manual Therapy  -A-P mobilization to R Shoulder 3x30 -Inferior mobilizations to R Shoulder 3x30      Therapeutic Exercise  -Lat Pulldown x10 10#, x10 15# -D2 Shoulder Pattern RTB x12  -RTB IR -- x15  -Standing Shoulder Flexion in Scaption 2x15 2#  -Omega Machine Rows - 2x12 15lbs  -Pulleys Shoulder Flexion x4 minutes  -Push-up Plus x15   Performed exercises in standing to decrease pain, increase ROM and strength     PT Education - 01/15/20 0911    Education Details Form/Technique with Exercises    Person(s) Educated Patient    Methods Explanation;Demonstration    Comprehension Verbalized understanding;Returned demonstration            PT Short Term Goals - 12/18/19 0910      PT SHORT TERM GOAL #1   Title Patient will have a worst pain score of 5/10 to indicated significant improvement in L shoulder function.    Baseline 7/10 worst pain; 12/18/19: 6/10    Time 6    Period Weeks    Status On-going    Target Date 12/09/19  PT Long Term Goals - 12/18/19 0900      PT LONG TERM GOAL #1   Title Patient will have an Apply scratch test equal the unaffected side to indicate significant improvement with ability to don a brassiere.    Baseline R behind back: (T7); behind neck: (back of the head); L behind back: (T4); L behind neck:(T5) ;9/23: R BB: T6 L BB: T7, R BH: T3 L BH: T5    Time 6    Period Weeks    Status On-going      PT LONG TERM GOAL #2   Title Patient will be independent with HEP to continue benefits of therapy after discharge    Baseline dependent with form/technique    Time 6    Period Weeks    Status On-going      PT LONG TERM GOAL #3   Title Patient will demonstrate signifcant improvement with FOTO score to 60 to indicate significant improvement with pain and spasms and be able to lift arm overhead with less pain.    Baseline 30; 12/18/19: 62    Time 6    Period Weeks    Status Achieved       PT LONG TERM GOAL #4   Title Patient will have a worst pain of 2/10 to indicate signifcant improvement with overhead shoulder motion.    Baseline 7/10; 12/18/19: 6/10    Time 6    Period Weeks    Status On-going                 Plan - 01/15/20 0935    Clinical Impression Statement Patient's shoulder remains painful and limits her ability to perform OH activities. Patient responded well to manual therapy which helped to decrease her pain and improve pain-free ROM. Patient continues to have pain in mid-range movements and is having difficulty with ER due to pain. Patient will continue to benefit from skilled therapy to return to prior level of function.    Personal Factors and Comorbidities Comorbidity 2    Comorbidities HTN, carpal tunnel    Examination-Activity Limitations Lift    Stability/Clinical Decision Making Stable/Uncomplicated    Clinical Decision Making Moderate    Rehab Potential Good    PT Frequency 2x / week    PT Duration 6 weeks    PT Treatment/Interventions Therapeutic activities;Therapeutic exercise;Neuromuscular re-education;Electrical Stimulation;Iontophoresis 4mg /ml Dexamethasone;Cryotherapy;Canalith Repostioning;Moist Heat;Manual techniques;Dry needling;Passive range of motion;Patient/family education;Spinal Manipulations;Joint Manipulations    PT Next Visit Plan progress AROM and strength    PT Home Exercise Plan see education section    Consulted and Agree with Plan of Care Patient           Patient will benefit from skilled therapeutic intervention in order to improve the following deficits and impairments:  Pain, Decreased coordination, Decreased mobility, Increased muscle spasms, Decreased range of motion, Decreased endurance, Decreased activity tolerance, Hypomobility, Decreased strength  Visit Diagnosis: Stiffness of left shoulder, not elsewhere classified  Left shoulder pain, unspecified chronicity     Problem List Patient Active Problem List    Diagnosis Date Noted  . CIN I (cervical intraepithelial neoplasia I) 09/30/2019  . Papanicolaou smear of cervix with atypical squamous cells cannot exclude high grade squamous intraepithelial lesion (ASC-H) 03/29/2018  . Fibroids 01/18/2017   9:45 AM, 01/15/20 Margarito Liner, SPT Student Physical Therapist [place]  [phone]  Margarito Liner 01/15/2020, 9:37 AM  [place] [address] [place], [address]  Phone: [phone]   Fax:  [phone]  Name: Olivia Sanchez MRN: [id number] Date of Birth: 02-28-1966

## 2020-01-19 DIAGNOSIS — G8929 Other chronic pain: Secondary | ICD-10-CM | POA: Diagnosis not present

## 2020-01-19 DIAGNOSIS — M7541 Impingement syndrome of right shoulder: Secondary | ICD-10-CM | POA: Diagnosis not present

## 2020-01-19 DIAGNOSIS — R200 Anesthesia of skin: Secondary | ICD-10-CM | POA: Diagnosis not present

## 2020-01-19 DIAGNOSIS — R202 Paresthesia of skin: Secondary | ICD-10-CM | POA: Diagnosis not present

## 2020-01-19 DIAGNOSIS — M25511 Pain in right shoulder: Secondary | ICD-10-CM | POA: Diagnosis not present

## 2020-01-20 ENCOUNTER — Other Ambulatory Visit: Payer: Self-pay | Admitting: Physician Assistant

## 2020-01-20 DIAGNOSIS — M25511 Pain in right shoulder: Secondary | ICD-10-CM

## 2020-01-20 DIAGNOSIS — M7541 Impingement syndrome of right shoulder: Secondary | ICD-10-CM

## 2020-01-20 DIAGNOSIS — G8929 Other chronic pain: Secondary | ICD-10-CM

## 2020-01-22 ENCOUNTER — Ambulatory Visit: Payer: 59

## 2020-01-22 ENCOUNTER — Other Ambulatory Visit: Payer: Self-pay

## 2020-02-04 ENCOUNTER — Other Ambulatory Visit: Payer: Self-pay | Admitting: Internal Medicine

## 2020-02-04 DIAGNOSIS — R739 Hyperglycemia, unspecified: Secondary | ICD-10-CM | POA: Diagnosis not present

## 2020-02-04 DIAGNOSIS — Z1231 Encounter for screening mammogram for malignant neoplasm of breast: Secondary | ICD-10-CM

## 2020-02-04 DIAGNOSIS — E7800 Pure hypercholesterolemia, unspecified: Secondary | ICD-10-CM | POA: Diagnosis not present

## 2020-02-04 DIAGNOSIS — I10 Essential (primary) hypertension: Secondary | ICD-10-CM | POA: Diagnosis not present

## 2020-02-04 DIAGNOSIS — Z79899 Other long term (current) drug therapy: Secondary | ICD-10-CM | POA: Diagnosis not present

## 2020-02-04 DIAGNOSIS — Z0000 Encounter for general adult medical examination without abnormal findings: Secondary | ICD-10-CM | POA: Diagnosis not present

## 2020-02-06 DIAGNOSIS — G8929 Other chronic pain: Secondary | ICD-10-CM | POA: Diagnosis not present

## 2020-02-06 DIAGNOSIS — M19042 Primary osteoarthritis, left hand: Secondary | ICD-10-CM | POA: Diagnosis not present

## 2020-02-06 DIAGNOSIS — M25511 Pain in right shoulder: Secondary | ICD-10-CM | POA: Diagnosis not present

## 2020-02-06 DIAGNOSIS — M19041 Primary osteoarthritis, right hand: Secondary | ICD-10-CM | POA: Diagnosis not present

## 2020-02-09 ENCOUNTER — Ambulatory Visit
Admission: RE | Admit: 2020-02-09 | Discharge: 2020-02-09 | Disposition: A | Discharge status: Home / Self Care | Payer: 59 | Source: Ambulatory Visit | Attending: Physician Assistant | Admitting: Physician Assistant

## 2020-02-09 ENCOUNTER — Other Ambulatory Visit: Payer: Self-pay

## 2020-02-09 DIAGNOSIS — M7541 Impingement syndrome of right shoulder: Secondary | ICD-10-CM | POA: Diagnosis not present

## 2020-02-09 DIAGNOSIS — M25511 Pain in right shoulder: Secondary | ICD-10-CM | POA: Diagnosis not present

## 2020-02-09 DIAGNOSIS — G8929 Other chronic pain: Secondary | ICD-10-CM | POA: Diagnosis not present

## 2020-02-17 DIAGNOSIS — H401131 Primary open-angle glaucoma, bilateral, mild stage: Secondary | ICD-10-CM | POA: Diagnosis not present

## 2020-03-01 ENCOUNTER — Other Ambulatory Visit: Payer: Self-pay | Admitting: Psychiatry

## 2020-03-08 ENCOUNTER — Ambulatory Visit
Admission: RE | Admit: 2020-03-08 | Discharge: 2020-03-08 | Disposition: A | Discharge status: Home / Self Care | Payer: 59 | Source: Ambulatory Visit | Attending: Internal Medicine | Admitting: Internal Medicine

## 2020-03-08 ENCOUNTER — Other Ambulatory Visit: Payer: Self-pay

## 2020-03-08 DIAGNOSIS — Z1231 Encounter for screening mammogram for malignant neoplasm of breast: Secondary | ICD-10-CM | POA: Diagnosis not present

## 2020-04-02 ENCOUNTER — Ambulatory Visit: Payer: 59 | Admitting: Obstetrics & Gynecology

## 2020-04-06 ENCOUNTER — Other Ambulatory Visit (HOSPITAL_COMMUNITY)
Admission: RE | Admit: 2020-04-06 | Discharge: 2020-04-06 | Disposition: A | Discharge status: Home / Self Care | Payer: 59 | Source: Ambulatory Visit | Attending: Obstetrics & Gynecology | Admitting: Obstetrics & Gynecology

## 2020-04-06 ENCOUNTER — Ambulatory Visit (INDEPENDENT_AMBULATORY_CARE_PROVIDER_SITE_OTHER): Payer: 59 | Admitting: Obstetrics & Gynecology

## 2020-04-06 ENCOUNTER — Other Ambulatory Visit: Payer: Self-pay

## 2020-04-06 ENCOUNTER — Encounter: Payer: Self-pay | Admitting: Obstetrics & Gynecology

## 2020-04-06 ENCOUNTER — Other Ambulatory Visit: Payer: Self-pay | Admitting: Obstetrics & Gynecology

## 2020-04-06 VITALS — BP 120/70 | Ht 65.0 in | Wt 182.0 lb

## 2020-04-06 DIAGNOSIS — Z202 Contact with and (suspected) exposure to infections with a predominantly sexual mode of transmission: Secondary | ICD-10-CM

## 2020-04-06 DIAGNOSIS — Z3041 Encounter for surveillance of contraceptive pills: Secondary | ICD-10-CM | POA: Diagnosis not present

## 2020-04-06 DIAGNOSIS — Z01419 Encounter for gynecological examination (general) (routine) without abnormal findings: Secondary | ICD-10-CM

## 2020-04-06 DIAGNOSIS — D219 Benign neoplasm of connective and other soft tissue, unspecified: Secondary | ICD-10-CM

## 2020-04-06 DIAGNOSIS — N870 Mild cervical dysplasia: Secondary | ICD-10-CM | POA: Diagnosis not present

## 2020-04-06 MED ORDER — NORETHINDRONE 0.35 MG PO TABS: 1.0000 | ORAL_TABLET | Freq: Every day | ORAL | 3 refills | Status: DC

## 2020-04-06 NOTE — Progress Notes (Signed)
HPI:      [name] is a 55 y.o. G0P0000 who LMP was in the past, she presents today for her annual examination.  The patient has no complaints today other than occas vag d/c or itvching, prior dx of BV. The patient is not currently sexually active. Herlast pap: approximate date 09/2019 and was normal and 2019 CIN I (last 3 PAPs have ben normal in follow up); and last mammogram: approximate date 02/2020 and was normal.  The patient does perform self breast exams.  There is notable family history of breast or ovarian cancer in her family.   The patient is taking hormone replacement therapy (actually Norethinedrone OCP for fibroid related control of bleeding).  She has 6-8 episodes this past year of light irreg bleeding.   Has h/o fibroids, no other sx's form fibroids.  Has had hot flashes for 4 years now..  Desires to stay on POP for now.  The patient has regular exercise: yes. The patient denies current symptoms of depression.    GYN Hx: Last Colonoscopy:6 years ago. Normal.     PMHx: Past Medical History:  Diagnosis Date  . Arthritis   . Depression   . Esophageal reflux   . Family history of adverse reaction to anesthesia    maternal aunt and maternal uncle and aunts daughter have malignant hyperthermia  . Fibroids   . Glaucoma   . Herpes genitalia   . Hypercholesteremia   . Hypertension   . Malignant hyperthermia    HUGE FAMILY HISTORY OF MH-PTS MATERNAL AUNT AND UNCLE AND COUSIN-PT HAS NEVER HAD ANY ISSUES BUT SHE HAS ONLY HAD COLONOSCOPY AND EGD  . Thyroid disease    Past Surgical History:  Procedure Laterality Date  . COLONOSCOPY  02/2014   Dr.Elliott  . CYSTOSCOPY    . EAR CYST EXCISION Left 04/21/2019   Procedure: EXCISION OF CYST FROM LEFT KNEE;  Surgeon: Dereck Leep, MD;  Location: [facility] ORS;  Service: Orthopedics;  Laterality: Left;  . ESOPHAGOGASTRODUODENOSCOPY     Family History  Problem Relation Age of Onset  . Dementia Mother        senile, early  stages  . Hypertension Mother   . Breast cancer Maternal Aunt 83   Social History   Tobacco Use  . Smoking status: Never Smoker  . Smokeless tobacco: Never Used  Vaping Use  . Vaping Use: Never used  Substance Use Topics  . Alcohol use: Not Currently  . Drug use: No    Current Outpatient Medications:  .  acetaminophen (TYLENOL) 650 MG CR tablet, Take 1,300 mg by mouth every 8 (eight) hours as needed for pain., Disp: , Rfl:  .  ALPRAZolam (XANAX) 0.25 MG tablet, Take 0.25 mg by mouth daily as needed for anxiety or sleep. , Disp: , Rfl: 2 .  Aspirin-Caffeine (BC FAST PAIN RELIEF PO), Take 1 packet by mouth 2 (two) times daily as needed (pain)., Disp: , Rfl:  .  Biotin w/ Vitamins C & E (HAIR SKIN & NAILS GUMMIES PO), Take 2 capsules by mouth daily., Disp: , Rfl:  .  buPROPion (WELLBUTRIN SR) 150 MG 12 hr tablet, Take 300 mg by mouth every morning. , Disp: , Rfl:  .  Cholecalciferol (VITAMIN D) 50 MCG (2000 UT) tablet, Take 2,000 Units by mouth daily., Disp: , Rfl:  .  Dermatological Products, Misc. (NUVAIL) SOLN, Apply 1 application topically daily. Apply to toenails, Disp: , Rfl:  .  etodolac (LODINE) 400  MG tablet, Take 400 mg by mouth daily. , Disp: , Rfl:  .  HYDROcodone-acetaminophen (NORCO) 5-325 MG tablet, Take 1-2 tablets by mouth every 4 (four) hours as needed for moderate pain., Disp: 10 tablet, Rfl: 0 .  latanoprost (XALATAN) 0.005 % ophthalmic solution, Place 1 drop into both eyes daily., Disp: , Rfl:  .  methocarbamol (ROBAXIN) 500 MG tablet, Take 500 mg by mouth 2 (two) times daily. , Disp: , Rfl:  .  methylphenidate (RITALIN) 20 MG tablet, Take 20 mg by mouth 2 (two) times daily., Disp: , Rfl:  .  norethindrone (MICRONOR) 0.35 MG tablet, Take 1 tablet (0.35 mg total) by mouth daily., Disp: 84 tablet, Rfl: 3 .  omeprazole (PRILOSEC) 20 MG capsule, Take 20 mg by mouth every morning. , Disp: , Rfl:  .  propranolol ER (INDERAL LA) 60 MG 24 hr capsule, Take 60 mg by mouth every  morning. , Disp: , Rfl: 11 .  sertraline (ZOLOFT) 100 MG tablet, Take 100 mg by mouth every morning. Take with 50 mg to equal 150 mg daily, Disp: , Rfl: 5 .  sertraline (ZOLOFT) 50 MG tablet, Take 50 mg by mouth daily. Take with 100 mg to equal 150 mg daily, Disp: , Rfl:  .  simvastatin (ZOCOR) 40 MG tablet, Take 40 mg by mouth every morning. , Disp: , Rfl: 3 .  topiramate (TOPAMAX) 50 MG tablet, Take 50 mg by mouth 2 (two) times daily., Disp: , Rfl:  .  valsartan (DIOVAN) 160 MG tablet, Take 160 mg by mouth every morning. , Disp: , Rfl:  .  VYTONE 1-1.9 % CREA, Apply 1 application topically daily. Apply to under breast and lower abdomen, Disp: , Rfl:  .  zaleplon (SONATA) 10 MG capsule, Take 10 mg by mouth daily. , Disp: , Rfl: 5 Allergies: Patient has no known allergies.  Review of Systems  Constitutional: Negative for chills, fever and malaise/fatigue.  HENT: Negative for congestion, sinus pain and sore throat.   Eyes: Negative for blurred vision and pain.  Respiratory: Negative for cough and wheezing.   Cardiovascular: Negative for chest pain and leg swelling.  Gastrointestinal: Negative for abdominal pain, constipation, diarrhea, heartburn, nausea and vomiting.  Genitourinary: Negative for dysuria, frequency, hematuria and urgency.  Musculoskeletal: Negative for back pain, joint pain, myalgias and neck pain.  Skin: Negative for itching and rash.  Neurological: Negative for dizziness, tremors and weakness.  Endo/Heme/Allergies: Does not bruise/bleed easily.  Psychiatric/Behavioral: Negative for depression. The patient is not nervous/anxious and does not have insomnia.     Objective: BP 120/70   Ht 5\' 5"  (1.651 m)   Wt 182 lb (82.6 kg)   LMP  (LMP Unknown)   BMI 30.29 kg/m   Filed Weights   04/06/20 0902  Weight: 182 lb (82.6 kg)   Body mass index is 30.29 kg/m. Physical Exam Constitutional:      General: She is not in acute distress.    Appearance: She is well-developed  and well-nourished.  Genitourinary:     Vagina, uterus and rectum normal.     There is no rash or lesion on the right labia.     There is no rash or lesion on the left labia.    No lesions in the vagina.     No vaginal bleeding.      Right Adnexa: not tender and no mass present.    Left Adnexa: not tender and no mass present.    No cervical motion  tenderness, friability, lesion or polyp.     Uterus is mobile.     Uterus is not enlarged.     No uterine mass detected.    Uterus exam comments: No mass palpated Exam limited by obesity.     Uterus is midaxial.     Pelvic exam was performed with patient supine.  Breasts:     Right: No mass, skin change or tenderness.     Left: No mass, skin change or tenderness.    HENT:     Head: Normocephalic and atraumatic. No laceration.     Right Ear: Hearing normal.     Left Ear: Hearing normal.     Nose: No epistaxis or foreign body.     Mouth/Throat:     Mouth: Oropharynx is clear and moist and mucous membranes are normal.     Pharynx: Uvula midline.  Eyes:     Pupils: Pupils are equal, round, and reactive to light.  Neck:     Thyroid: No thyromegaly.  Cardiovascular:     Rate and Rhythm: Normal rate and regular rhythm.     Heart sounds: No murmur heard. No friction rub. No gallop.   Pulmonary:     Effort: Pulmonary effort is normal. No respiratory distress.     Breath sounds: Normal breath sounds. No wheezing.  Abdominal:     General: Bowel sounds are normal. There is no distension.     Palpations: Abdomen is soft.     Tenderness: There is no abdominal tenderness. There is no rebound.  Musculoskeletal:        General: Normal range of motion.     Cervical back: Normal range of motion and neck supple.  Neurological:     Mental Status: She is alert and oriented to person, place, and time.     Cranial Nerves: No cranial nerve deficit.  Skin:    General: Skin is warm and dry.  Psychiatric:        Mood and Affect: Mood and affect  normal.        Judgment: Judgment normal.  Vitals reviewed.     Assessment: Annual Exam 1. CIN I (cervical intraepithelial neoplasia I)   2. Fibroids   3. Women's annual routine gynecological examination     Plan:            1.  Cervical Screening-  Pap smear done today If normal, resume yearly PAP checks  2. Breast screening- Exam annually and mammogram scheduled  3. Colonoscopy every 10 years, Hemoccult testing after age 21  4. 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  5. Counseling for hormonal therapy: no change in therapy today Desires to cont POP for bleeding control and fibroid control of sx's.  Will consider d/c meds in future as may not need once menopausal.  Does have hot flashes.    F/U  Return in about 1 year (around 04/06/2021) for Annual.  Barnett Applebaum, MD, [facility], [facility] 04/06/2020  9:17 AM

## 2020-04-06 NOTE — Patient Instructions (Addendum)
PAP every 6 mos (or yearly if normal today) Mammogram every year/ done 02/2020 Colonoscopy every 10 years Labs yearly (with PCP)  Thank you for choosing [facility]. As part of our ongoing efforts to improve patient experience, we would appreciate your feedback. Please fill out the short survey that you will receive by mail or MyChart. Your opinion is important to [place]! - Dr. Kenton Kingfisher  Recommendations to boost your immunity to prevent illness such as viral flu and colds, including covid19, are as follows: Vitamin K2 and Vitamin D3. Take Vitamin K2 at 200-300 mcg daily (usually 2-3 pills daily of the over the counter formulation). Take Vitamin D3 at 3000-4000 U daily (usually 3-4 pills daily of the over the counter formulation). Studies show that these two at high normal levels in your system are very effective in keeping your immunity so strong and protective that you will be unlikely to contract viral illness such as those listed above.  Dr Kenton Kingfisher

## 2020-04-07 LAB — CERVICOVAGINAL ANCILLARY ONLY
Bacterial Vaginitis (gardnerella): NEGATIVE
Candida Glabrata: NEGATIVE
Candida Vaginitis: NEGATIVE
Chlamydia: POSITIVE — AB
Comment: NEGATIVE
Comment: NEGATIVE
Comment: NEGATIVE
Comment: NEGATIVE
Comment: NEGATIVE
Comment: NORMAL
Neisseria Gonorrhea: NEGATIVE
Trichomonas: NEGATIVE

## 2020-04-08 ENCOUNTER — Other Ambulatory Visit: Payer: Self-pay | Admitting: Obstetrics & Gynecology

## 2020-04-08 LAB — RPR+HSVIGM+HBSAG+HSV2(IGG)+...
HIV Screen 4th Generation wRfx: NONREACTIVE
HSV 2 IgG, Type Spec: 1.24 index — ABNORMAL HIGH (ref 0.00–0.90)
HSVI/II Comb IgM: <0.91 Ratio (ref 0.00–0.90)
Hepatitis B Surface Ag: NEGATIVE
RPR Ser Ql: NONREACTIVE

## 2020-04-08 LAB — CYTOLOGY - PAP
Adequacy: ABSENT
Diagnosis: NEGATIVE

## 2020-04-08 LAB — HSV-2 IGG SUPPLEMENTAL TEST: HSV-2 IgG Supplemental Test: POSITIVE — AB

## 2020-04-08 MED ORDER — AZITHROMYCIN 500 MG PO TABS: 1000.0000 mg | ORAL_TABLET | Freq: Once | ORAL | 0 refills | Status: DC

## 2020-04-14 DIAGNOSIS — M75121 Complete rotator cuff tear or rupture of right shoulder, not specified as traumatic: Secondary | ICD-10-CM | POA: Diagnosis not present

## 2020-04-30 ENCOUNTER — Other Ambulatory Visit: Payer: Self-pay | Admitting: Internal Medicine

## 2020-05-05 ENCOUNTER — Other Ambulatory Visit: Payer: Self-pay | Admitting: Internal Medicine

## 2020-05-14 ENCOUNTER — Other Ambulatory Visit: Payer: Self-pay | Admitting: Internal Medicine

## 2020-05-18 ENCOUNTER — Other Ambulatory Visit: Payer: Self-pay | Admitting: Optometry

## 2020-05-18 DIAGNOSIS — H401131 Primary open-angle glaucoma, bilateral, mild stage: Secondary | ICD-10-CM | POA: Diagnosis not present

## 2020-05-20 IMAGING — MR MR HEAD WO/W CM
14 series · 48 of 48 positions shown · IV contrast (gadavist)
Comparison: None.

CLINICAL DATA: Chronic daily headache, right-sided

EXAM:
MRI HEAD WITHOUT AND WITH CONTRAST
TECHNIQUE: Multiplanar, multiecho pulse sequences of the brain and surrounding
structures were obtained without and with intravenous contrast.
CONTRAST:  10mL GADAVIST GADOBUTROL 1 MMOL/ML IV SOLN

[Series 5: ax dwi_tracew · axial · 3.0mm · 0.60mm/px · z∈[-57,+93]mm · 3 of 48 slices shown]
[im 1/48]
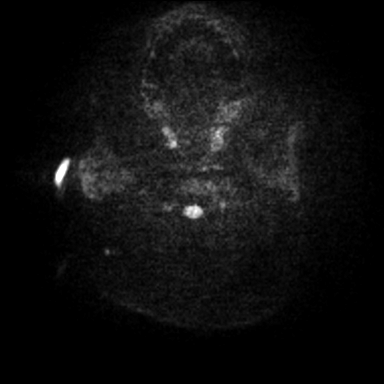
[im 24/48]
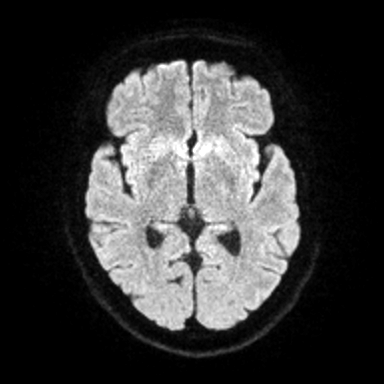
[im 48/48]
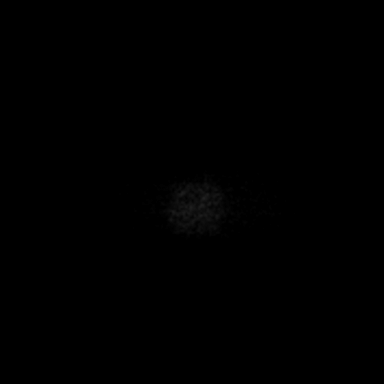

[Series 6: ax dwi_adc · axial · 3.0mm · 0.60mm/px · z∈[-57,+87]mm · 3 of 46 slices shown]
[im 1/46]
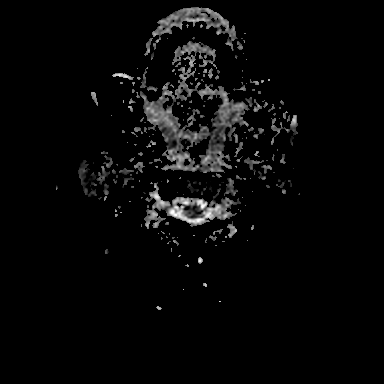
[im 23/46]
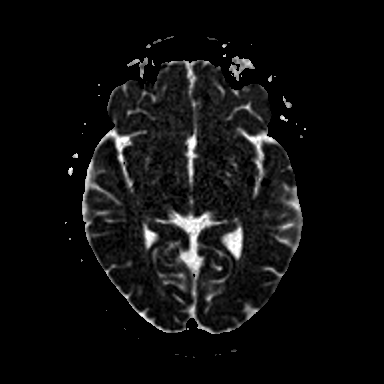
[im 46/46]
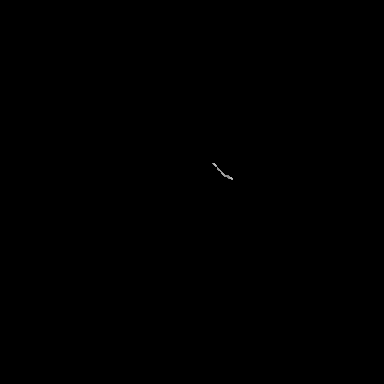

[Series 7: T1 · sagittal · 5.0mm · 0.62mm/px · 1 of 25 slices shown (1 of 2)]
[im 1/25]
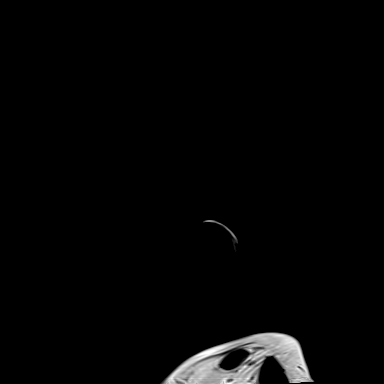

[Series 8: cor dwi_tracew · coronal · 5.0mm · 0.60mm/px · 2 of 38 slices shown]
[im 1/38]
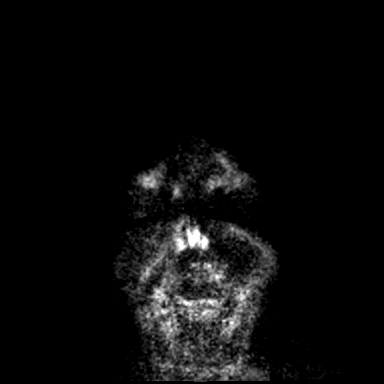
[im 38/38]
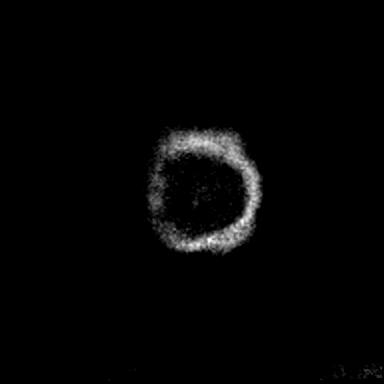

[Series 9: cor dwi_adc · coronal · 5.0mm · 0.60mm/px · 2 of 38 slices shown]
[im 1/38]
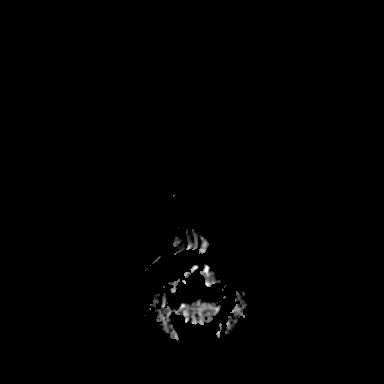
[im 38/38]
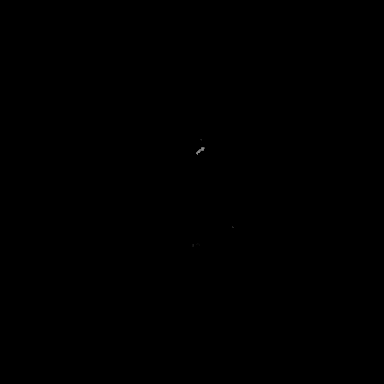

[Series 10: T2 · axial · 5.0mm · 0.53mm/px · 1 of 25 slices shown]
[im 1/25]
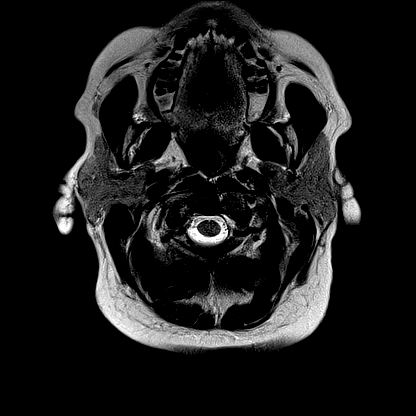

[Series 11: mag_images · axial · 3.0mm · 0.90mm/px · z∈[-68,+104]mm · 3 of 60 slices shown]
[im 1/60]
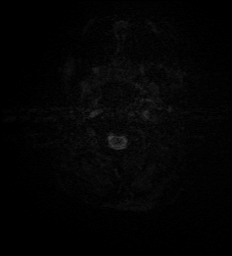
[im 30/60]
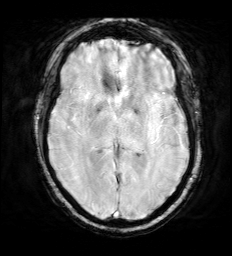
[im 60/60]
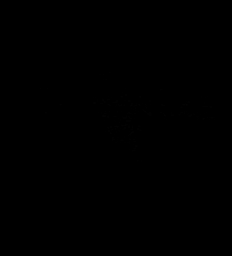

[Series 12: pha_images · axial · 3.0mm · 0.90mm/px · z∈[-68,+104]mm · 3 of 57 slices shown]
[im 1/57]
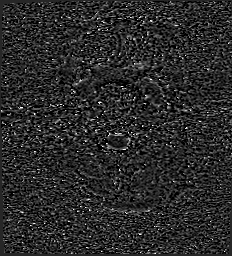
[im 29/57]
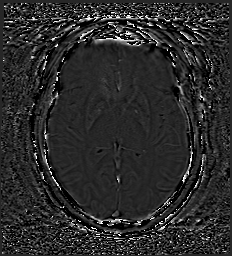
[im 57/57]
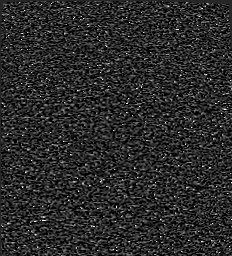

[Series 13: swi_images · axial · 3.0mm · 0.90mm/px · z∈[-68,+104]mm · 3 of 60 slices shown]
[im 1/60]
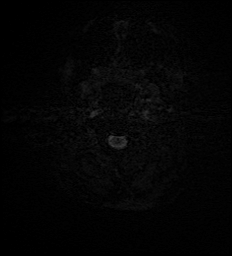
[im 30/60]
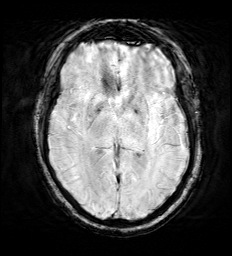
[im 60/60]
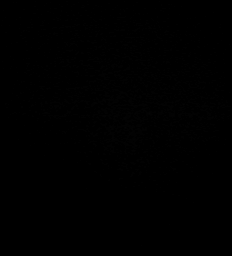

[Series 15: FLAIR · axial · 3.0mm · 0.53mm/px · z∈[-59,+99]mm · 3 of 55 slices shown]
[im 1/55]
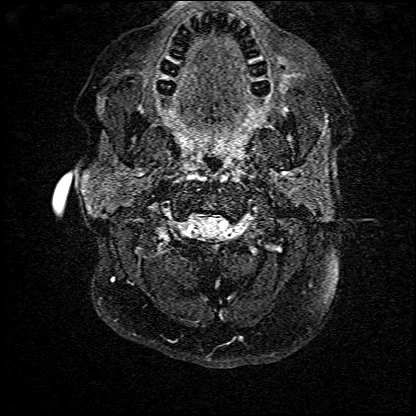
[im 28/55]
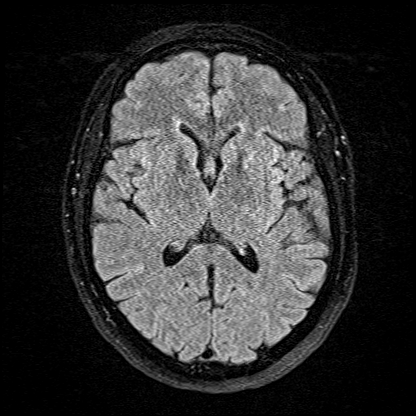
[im 55/55]
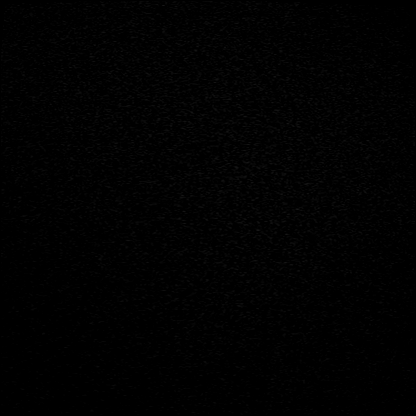

[Series 16: T1 · axial · 1.0mm · 0.98mm/px · z∈[-69,+100]mm · 10 of 172 slices shown (2 of 2)]
[im 1/172]
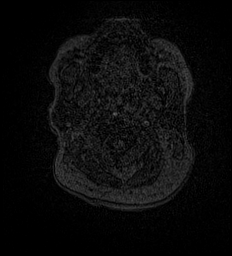
[im 20/172]
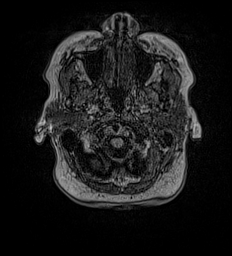
[im 39/172]
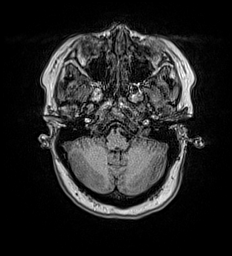
[im 58/172]
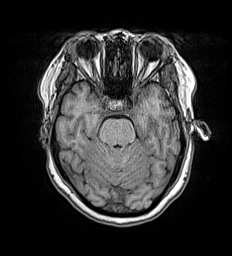
[im 77/172]
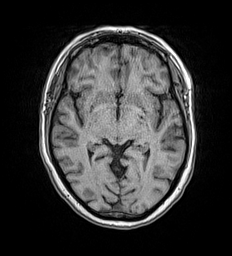
[im 96/172]
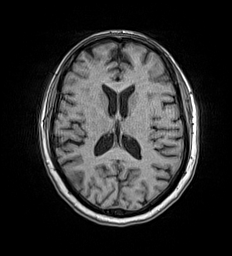
[im 115/172]
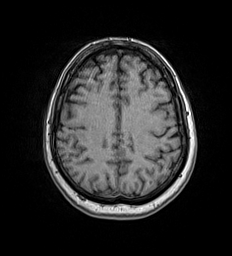
[im 134/172]
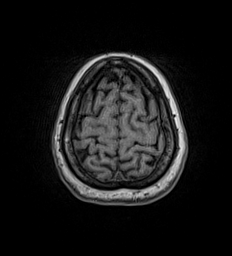
[im 153/172]
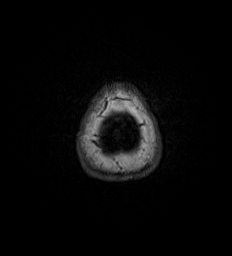
[im 172/172]
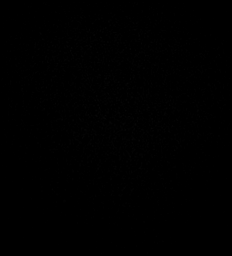

[Series 17: T2 post-contrast · coronal · 5.0mm · 0.57mm/px · 2 of 29 slices shown]
[im 1/29]
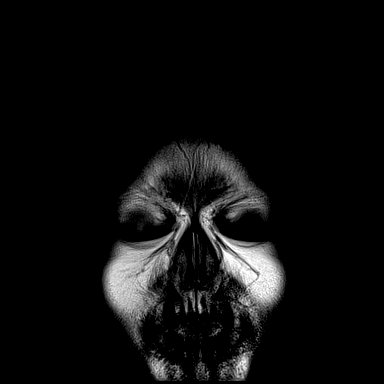
[im 29/29]
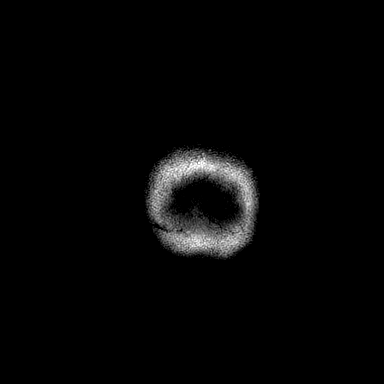

[Series 18: T1 post-contrast · axial · 1.0mm · 0.98mm/px · z∈[-69,+100]mm · 10 of 175 slices shown (1 of 2)]
[im 1/175]
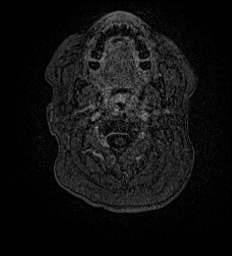
[im 20/175]
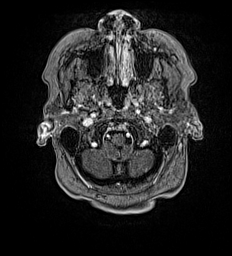
[im 39/175]
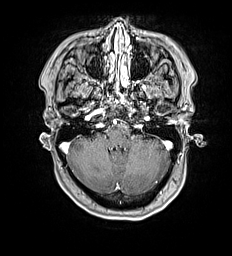
[im 59/175]
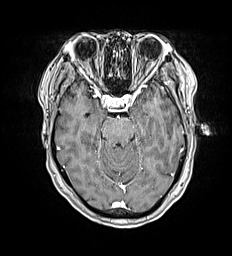
[im 78/175]
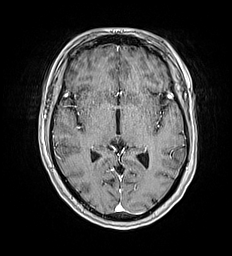
[im 97/175]
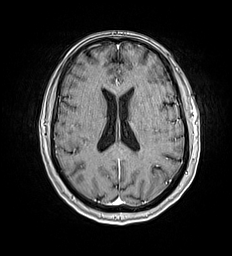
[im 117/175]
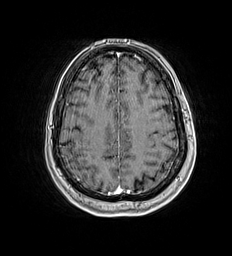
[im 136/175]
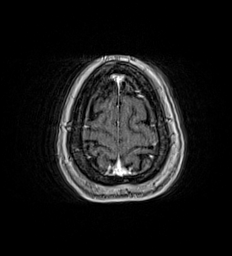
[im 155/175]
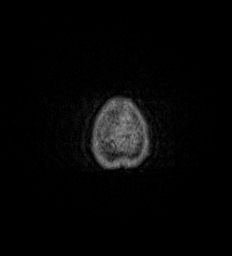
[im 175/175]
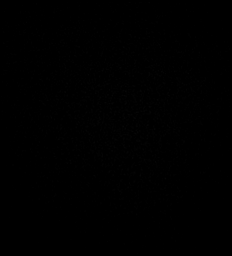

[Series 19: T1 post-contrast · coronal · 5.0mm · 0.57mm/px · 2 of 29 slices shown (2 of 2)]
[im 1/29]
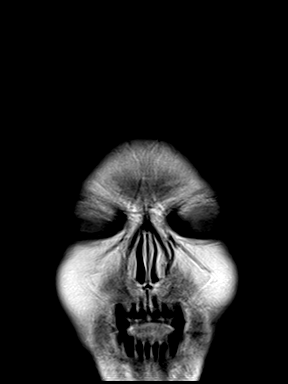
[im 29/29]
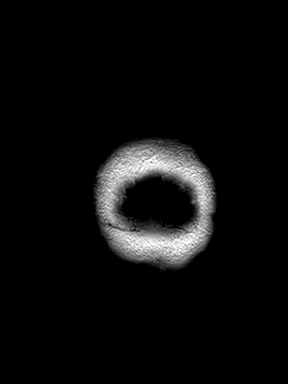

[48 of 48 positions shown; findings below may reference images not displayed]

FINDINGS: Brain: No acute infarction, hemorrhage, hydrocephalus, extra-axial
collection or mass lesion. No abnormal enhancement, white matter
disease, or atrophy

Vascular: Normal flow voids and vascular enhancements

Skull and upper cervical spine: Normal marrow signal

Sinuses/Orbits: Negative.  No sinusitis or orbital inflammation.
IMPRESSION: Negative brain MRI.  No explanation for headache.

## 2020-06-07 DIAGNOSIS — E7800 Pure hypercholesterolemia, unspecified: Secondary | ICD-10-CM | POA: Diagnosis not present

## 2020-06-07 DIAGNOSIS — R0789 Other chest pain: Secondary | ICD-10-CM | POA: Diagnosis not present

## 2020-06-07 DIAGNOSIS — I10 Essential (primary) hypertension: Secondary | ICD-10-CM | POA: Diagnosis not present

## 2020-06-08 ENCOUNTER — Other Ambulatory Visit: Payer: Self-pay | Admitting: Optometry

## 2020-06-08 ENCOUNTER — Ambulatory Visit: Payer: Self-pay | Attending: Internal Medicine

## 2020-06-08 DIAGNOSIS — Z23 Encounter for immunization: Secondary | ICD-10-CM

## 2020-06-08 NOTE — Progress Notes (Signed)
   Covid-19 Vaccination Clinic  Name:  Olivia Sanchez    MRN: [id number] DOB: April 02, 1965  06/08/2020  [name] was observed post Covid-19 immunization for 15 minutes without incident. She was provided with Vaccine Information Sheet and instruction to access the V-Safe system.   [name] was instructed to call 911 with any severe reactions post vaccine: Marland Kitchen Difficulty breathing  . Swelling of face and throat  . A fast heartbeat  . A bad rash all over body  . Dizziness and weakness   Immunizations Administered    Name Date Dose VIS Date Route   PFIZER Comrnaty(Gray TOP) Covid-19 Vaccine 06/08/2020 11:25 AM 0.3 mL 03/04/2020 Intramuscular   Manufacturer: [employer], [employer]   Lot: [id number]   Andrew: [phone]

## 2020-06-15 ENCOUNTER — Other Ambulatory Visit: Payer: Self-pay | Admitting: Neurology

## 2020-06-15 DIAGNOSIS — I10 Essential (primary) hypertension: Secondary | ICD-10-CM | POA: Diagnosis not present

## 2020-07-01 ENCOUNTER — Other Ambulatory Visit: Payer: Self-pay

## 2020-07-01 MED ORDER — METHYLPHENIDATE HCL 20 MG PO TABS
ORAL_TABLET | ORAL | 0 refills | Status: DC
  Filled 2020-07-01: qty 60, 30d supply, fill #0

## 2020-07-01 MED ORDER — ALPRAZOLAM 0.25 MG PO TABS
ORAL_TABLET | ORAL | 5 refills | Status: DC
  Filled 2020-07-01: qty 60, 15d supply, fill #0
  Filled 2020-12-14: qty 60, 15d supply, fill #1

## 2020-07-08 DIAGNOSIS — H401131 Primary open-angle glaucoma, bilateral, mild stage: Secondary | ICD-10-CM | POA: Diagnosis not present

## 2020-07-22 ENCOUNTER — Other Ambulatory Visit: Payer: Self-pay

## 2020-07-22 MED FILL — Latanoprost Ophth Soln 0.005%: OPHTHALMIC | 15 days supply | Qty: 2.5 | Fill #0 | Status: AC

## 2020-07-22 MED FILL — Valsartan Tab 160 MG: ORAL | 90 days supply | Qty: 90 | Fill #0 | Status: AC

## 2020-07-22 MED FILL — Propranolol HCl Cap ER 24HR 60 MG: ORAL | 30 days supply | Qty: 30 | Fill #0 | Status: AC

## 2020-07-22 MED FILL — Dorzolamide HCl-Timolol Maleate Ophth Soln 2-0.5%: OPHTHALMIC | 30 days supply | Qty: 10 | Fill #0 | Status: AC

## 2020-07-22 MED FILL — Omeprazole Cap Delayed Release 20 MG: ORAL | 90 days supply | Qty: 180 | Fill #0 | Status: AC

## 2020-07-22 MED FILL — Bupropion HCl Tab ER 24HR 300 MG: ORAL | 90 days supply | Qty: 90 | Fill #0 | Status: AC

## 2020-07-22 MED FILL — Etodolac Tab 400 MG: ORAL | 90 days supply | Qty: 180 | Fill #0 | Status: AC

## 2020-07-22 MED FILL — Rosuvastatin Calcium Tab 10 MG: ORAL | 90 days supply | Qty: 90 | Fill #0 | Status: AC

## 2020-07-23 ENCOUNTER — Other Ambulatory Visit: Payer: Self-pay

## 2020-07-26 ENCOUNTER — Other Ambulatory Visit: Payer: Self-pay

## 2020-08-04 ENCOUNTER — Other Ambulatory Visit: Payer: Self-pay

## 2020-08-04 DIAGNOSIS — R739 Hyperglycemia, unspecified: Secondary | ICD-10-CM | POA: Diagnosis not present

## 2020-08-04 DIAGNOSIS — Z79899 Other long term (current) drug therapy: Secondary | ICD-10-CM | POA: Diagnosis not present

## 2020-08-04 DIAGNOSIS — G44221 Chronic tension-type headache, intractable: Secondary | ICD-10-CM | POA: Diagnosis not present

## 2020-08-04 DIAGNOSIS — E7800 Pure hypercholesterolemia, unspecified: Secondary | ICD-10-CM | POA: Diagnosis not present

## 2020-08-04 DIAGNOSIS — I10 Essential (primary) hypertension: Secondary | ICD-10-CM | POA: Diagnosis not present

## 2020-08-04 MED ORDER — METHYLPHENIDATE HCL 20 MG PO TABS
ORAL_TABLET | ORAL | 0 refills | Status: DC
  Filled 2020-08-04: qty 180, 90d supply, fill #0

## 2020-08-05 ENCOUNTER — Other Ambulatory Visit: Payer: Self-pay

## 2020-08-25 ENCOUNTER — Other Ambulatory Visit: Payer: Self-pay

## 2020-08-25 MED ORDER — PROPRANOLOL HCL ER 60 MG PO CP24
ORAL_CAPSULE | Freq: Every day | ORAL | 11 refills | Status: DC
  Filled 2020-08-25: qty 30, 30d supply, fill #0

## 2020-08-26 ENCOUNTER — Other Ambulatory Visit: Payer: Self-pay

## 2020-08-26 MED ORDER — PROPRANOLOL HCL ER 60 MG PO CP24
ORAL_CAPSULE | ORAL | 3 refills | Status: DC
  Filled 2020-08-26 (×2): qty 90, 90d supply, fill #0
  Filled 2020-11-26: qty 90, 90d supply, fill #1
  Filled 2021-03-02: qty 90, 90d supply, fill #2
  Filled 2021-06-16: qty 90, 90d supply, fill #3

## 2020-08-31 ENCOUNTER — Other Ambulatory Visit: Payer: Self-pay

## 2020-08-31 MED ORDER — CARESTART COVID-19 HOME TEST VI KIT
PACK | 0 refills | Status: DC
  Filled 2020-08-31: qty 2, 4d supply, fill #0

## 2020-09-05 MED FILL — Methocarbamol Tab 500 MG: ORAL | 90 days supply | Qty: 180 | Fill #0 | Status: AC

## 2020-09-05 MED FILL — Nortriptyline HCl Cap 10 MG: ORAL | 30 days supply | Qty: 60 | Fill #0 | Status: AC

## 2020-09-06 ENCOUNTER — Other Ambulatory Visit: Payer: Self-pay

## 2020-09-17 ENCOUNTER — Other Ambulatory Visit: Payer: Self-pay

## 2020-09-17 MED FILL — Norethindrone Tab 0.35 MG: ORAL | 84 days supply | Qty: 84 | Fill #0 | Status: AC

## 2020-10-06 DIAGNOSIS — H401131 Primary open-angle glaucoma, bilateral, mild stage: Secondary | ICD-10-CM | POA: Diagnosis not present

## 2020-10-11 ENCOUNTER — Other Ambulatory Visit: Payer: Self-pay

## 2020-10-15 ENCOUNTER — Other Ambulatory Visit: Payer: Self-pay

## 2020-10-15 MED FILL — Bupropion HCl Tab ER 24HR 300 MG: ORAL | 90 days supply | Qty: 90 | Fill #1 | Status: CN

## 2020-10-15 MED FILL — Dorzolamide HCl-Timolol Maleate Ophth Soln 2-0.5%: OPHTHALMIC | 30 days supply | Qty: 10 | Fill #1 | Status: CN

## 2020-10-17 MED FILL — Sumatriptan Succinate Tab 100 MG: ORAL | 30 days supply | Qty: 9 | Fill #0 | Status: AC

## 2020-10-18 ENCOUNTER — Other Ambulatory Visit: Payer: Self-pay

## 2020-10-18 MED ORDER — LATANOPROST 0.005 % OP SOLN
OPHTHALMIC | 3 refills | Status: DC
  Filled 2020-10-18: qty 2.5, 25d supply, fill #0
  Filled 2020-11-28: qty 2.5, 25d supply, fill #1
  Filled 2021-01-14: qty 2.5, 25d supply, fill #2
  Filled 2021-03-27: qty 2.5, 25d supply, fill #3

## 2020-10-18 MED FILL — Bupropion HCl Tab ER 24HR 300 MG: ORAL | 90 days supply | Qty: 90 | Fill #1 | Status: AC

## 2020-10-20 ENCOUNTER — Other Ambulatory Visit: Payer: Self-pay

## 2020-10-20 MED FILL — Dorzolamide HCl-Timolol Maleate Ophth Soln 2-0.5%: OPHTHALMIC | 60 days supply | Qty: 10 | Fill #1 | Status: AC

## 2020-10-21 ENCOUNTER — Other Ambulatory Visit: Payer: Self-pay

## 2020-10-29 ENCOUNTER — Other Ambulatory Visit: Payer: Self-pay

## 2020-10-29 MED FILL — Rosuvastatin Calcium Tab 10 MG: ORAL | 90 days supply | Qty: 90 | Fill #1 | Status: AC

## 2020-10-29 MED FILL — Zaleplon Cap 10 MG: ORAL | 90 days supply | Qty: 90 | Fill #0 | Status: AC

## 2020-10-29 MED FILL — Topiramate Tab 100 MG: ORAL | 90 days supply | Qty: 90 | Fill #0 | Status: AC

## 2020-10-29 MED FILL — Sertraline HCl Tab 100 MG: ORAL | 90 days supply | Qty: 90 | Fill #0 | Status: AC

## 2020-10-31 ENCOUNTER — Other Ambulatory Visit: Payer: Self-pay

## 2020-11-01 ENCOUNTER — Other Ambulatory Visit: Payer: Self-pay

## 2020-11-02 ENCOUNTER — Other Ambulatory Visit: Payer: Self-pay

## 2020-11-02 MED ORDER — OMEPRAZOLE 20 MG PO CPDR
DELAYED_RELEASE_CAPSULE | Freq: Two times a day (BID) | ORAL | 1 refills | Status: DC
  Filled 2020-11-02: qty 180, 90d supply, fill #0
  Filled 2021-03-02: qty 180, 90d supply, fill #1

## 2020-11-03 ENCOUNTER — Other Ambulatory Visit: Payer: Self-pay

## 2020-11-24 ENCOUNTER — Other Ambulatory Visit: Payer: Self-pay

## 2020-11-24 DIAGNOSIS — M25512 Pain in left shoulder: Secondary | ICD-10-CM | POA: Diagnosis not present

## 2020-11-24 DIAGNOSIS — M75121 Complete rotator cuff tear or rupture of right shoulder, not specified as traumatic: Secondary | ICD-10-CM | POA: Diagnosis not present

## 2020-11-24 MED ORDER — TRAMADOL HCL 50 MG PO TABS
ORAL_TABLET | ORAL | 0 refills | Status: DC
  Filled 2020-11-24: qty 30, 30d supply, fill #0

## 2020-11-26 ENCOUNTER — Other Ambulatory Visit: Payer: Self-pay

## 2020-11-28 MED FILL — Nortriptyline HCl Cap 10 MG: ORAL | 30 days supply | Qty: 60 | Fill #1 | Status: AC

## 2020-11-30 ENCOUNTER — Other Ambulatory Visit: Payer: Self-pay

## 2020-12-12 MED FILL — Valsartan Tab 160 MG: ORAL | 90 days supply | Qty: 90 | Fill #1 | Status: CN

## 2020-12-13 ENCOUNTER — Other Ambulatory Visit: Payer: Self-pay

## 2020-12-13 MED FILL — Valsartan Tab 160 MG: ORAL | 90 days supply | Qty: 90 | Fill #1 | Status: AC

## 2020-12-14 ENCOUNTER — Other Ambulatory Visit: Payer: Self-pay

## 2020-12-14 DIAGNOSIS — B351 Tinea unguium: Secondary | ICD-10-CM | POA: Diagnosis not present

## 2020-12-15 ENCOUNTER — Other Ambulatory Visit: Payer: Self-pay

## 2020-12-15 MED ORDER — METHOCARBAMOL 500 MG PO TABS
ORAL_TABLET | Freq: Two times a day (BID) | ORAL | 3 refills | Status: DC
  Filled 2020-12-15: qty 180, 90d supply, fill #0
  Filled 2021-04-16: qty 180, 90d supply, fill #1
  Filled 2021-08-05: qty 180, 90d supply, fill #2
  Filled 2021-11-24: qty 10, 5d supply, fill #3
  Filled 2021-11-25: qty 170, 85d supply, fill #3

## 2020-12-20 ENCOUNTER — Other Ambulatory Visit: Payer: Self-pay

## 2020-12-20 MED ORDER — METHYLPHENIDATE HCL 20 MG PO TABS
ORAL_TABLET | ORAL | 0 refills | Status: DC
  Filled 2020-12-20: qty 180, 90d supply, fill #0

## 2020-12-23 ENCOUNTER — Other Ambulatory Visit: Payer: Self-pay

## 2020-12-27 DIAGNOSIS — M205X1 Other deformities of toe(s) (acquired), right foot: Secondary | ICD-10-CM | POA: Diagnosis not present

## 2020-12-27 DIAGNOSIS — L601 Onycholysis: Secondary | ICD-10-CM | POA: Diagnosis not present

## 2020-12-27 DIAGNOSIS — L603 Nail dystrophy: Secondary | ICD-10-CM | POA: Diagnosis not present

## 2020-12-29 ENCOUNTER — Other Ambulatory Visit: Payer: Self-pay

## 2020-12-29 DIAGNOSIS — G4733 Obstructive sleep apnea (adult) (pediatric): Secondary | ICD-10-CM | POA: Diagnosis not present

## 2020-12-29 DIAGNOSIS — G43019 Migraine without aura, intractable, without status migrainosus: Secondary | ICD-10-CM | POA: Diagnosis not present

## 2020-12-29 MED ORDER — NURTEC 75 MG PO TBDP
ORAL_TABLET | ORAL | 6 refills | Status: DC
  Filled 2020-12-29: qty 16, 16d supply, fill #0

## 2020-12-29 MED ORDER — TOPIRAMATE 100 MG PO TABS
ORAL_TABLET | ORAL | 3 refills | Status: DC
  Filled 2020-12-29: qty 90, 90d supply, fill #0

## 2020-12-29 MED ORDER — TOPIRAMATE 50 MG PO TABS
ORAL_TABLET | ORAL | 3 refills | Status: DC
  Filled 2020-12-29: qty 90, 90d supply, fill #0
  Filled 2021-08-05: qty 90, 90d supply, fill #1

## 2020-12-29 MED ORDER — SUMATRIPTAN SUCCINATE 100 MG PO TABS
ORAL_TABLET | ORAL | 3 refills | Status: DC
  Filled 2020-12-29: qty 27, 90d supply, fill #0

## 2021-01-03 ENCOUNTER — Other Ambulatory Visit: Payer: Self-pay

## 2021-01-10 ENCOUNTER — Other Ambulatory Visit: Payer: Self-pay

## 2021-01-14 ENCOUNTER — Other Ambulatory Visit: Payer: Self-pay

## 2021-01-14 MED ORDER — BUPROPION HCL ER (XL) 300 MG PO TB24
ORAL_TABLET | Freq: Every day | ORAL | 2 refills | Status: DC
  Filled 2021-01-14: qty 90, 90d supply, fill #0
  Filled 2021-06-16: qty 90, 90d supply, fill #1
  Filled 2021-08-26: qty 90, 90d supply, fill #2

## 2021-01-14 MED FILL — Topiramate Tab 100 MG: ORAL | 90 days supply | Qty: 90 | Fill #1 | Status: AC

## 2021-01-14 MED FILL — Dorzolamide HCl-Timolol Maleate Ophth Soln 2-0.5%: OPHTHALMIC | 60 days supply | Qty: 10 | Fill #2 | Status: AC

## 2021-01-14 MED FILL — Norethindrone Tab 0.35 MG: ORAL | 84 days supply | Qty: 84 | Fill #1 | Status: AC

## 2021-01-14 MED FILL — Sertraline HCl Tab 100 MG: ORAL | 90 days supply | Qty: 90 | Fill #1 | Status: AC

## 2021-01-15 ENCOUNTER — Other Ambulatory Visit: Payer: Self-pay

## 2021-01-17 ENCOUNTER — Other Ambulatory Visit: Payer: Self-pay

## 2021-01-17 MED ORDER — BUPROPION HCL ER (XL) 300 MG PO TB24
ORAL_TABLET | ORAL | 2 refills | Status: DC
  Filled 2021-01-17: qty 90, 90d supply, fill #0

## 2021-02-02 ENCOUNTER — Other Ambulatory Visit: Payer: Self-pay

## 2021-02-02 ENCOUNTER — Ambulatory Visit: Payer: 59 | Admitting: Dermatology

## 2021-02-02 DIAGNOSIS — L304 Erythema intertrigo: Secondary | ICD-10-CM | POA: Diagnosis not present

## 2021-02-02 DIAGNOSIS — L918 Other hypertrophic disorders of the skin: Secondary | ICD-10-CM

## 2021-02-02 MED ORDER — HYDROCORTISONE 2.5 % EX CREA
TOPICAL_CREAM | Freq: Two times a day (BID) | CUTANEOUS | 2 refills | Status: DC | PRN
  Filled 2021-02-02: qty 28, 20d supply, fill #0

## 2021-02-02 MED ORDER — KETOCONAZOLE 2 % EX CREA
1.0000 "application " | TOPICAL_CREAM | Freq: Two times a day (BID) | CUTANEOUS | 5 refills | Status: AC
  Filled 2021-02-02: qty 30, 15d supply, fill #0
  Filled 2021-06-24: qty 30, 15d supply, fill #1
  Filled 2021-09-22: qty 30, 15d supply, fill #2
  Filled 2021-11-11: qty 30, 15d supply, fill #3
  Filled 2021-12-30: qty 30, 15d supply, fill #4

## 2021-02-02 NOTE — Progress Notes (Signed)
   Follow-Up Visit   Subjective  Olivia Sanchez is a 55 y.o. female who presents for the following: Rash (Patient here today for rash at inframammary and groin area. Patient has seen Dr. Nehemiah Massed in the past and was given cream from [facility] for intertrigo. ).   The following portions of the chart were reviewed this encounter and updated as appropriate:   Tobacco  Allergies  Meds  Problems  Med Hx  Surg Hx  Fam Hx      Review of Systems:  No other skin or systemic complaints except as noted in HPI or Assessment and Plan.  Objective  Well appearing patient in no apparent distress; mood and affect are within normal limits.  A focused examination was performed including inframammary, trunk, hands. Relevant physical exam findings are noted in the Assessment and Plan.  inframammary Erythematous patches   Assessment & Plan  Erythema intertrigo inframammary  Chronic condition with duration or expected duration over one year. Condition is bothersome to patient. Currently flared.  Intertrigo is a chronic recurrent rash that occurs in skin fold areas that may be associated with friction; heat; moisture; yeast; fungus; and bacteria.  It is exacerbated by increased movement / activity; sweating; and higher atmospheric temperature.  Start ketoconazole 2% cream twice daily for rash as needed. Start HC 2.5% cream twice daily for up to 2 weeks as needed for rash.   Topical steroids (such as triamcinolone, fluocinolone, fluocinonide, mometasone, clobetasol, halobetasol, betamethasone, hydrocortisone) can cause thinning and lightening of the skin if they are used for too long in the same area. Your physician has selected the right strength medicine for your problem and area affected on the body. Please use your medication only as directed by your physician to prevent side effects.   If this does not clear the rash for her, she will call and we will send in rx for [facility]  compounded medication  ketoconazole (NIZORAL) 2 % cream - inframammary Apply 1 application topically 2 (two) times daily. As needed for rash  hydrocortisone 2.5 % cream - inframammary Apply topically 2 (two) times daily as needed (Rash). For up to 2 weeks at a time as needed for rash.  Acrochordons (Skin Tags) - Removal desired by patient - Fleshy, skin-colored pedunculated papules at right neck, right flank x 2, right breast x 1 - Benign appearing.  - Patient desires removal. Reviewed that this is not covered by insurance and they will be charged a cosmetic fee for removal. Patient signed non-covered consent.  - Prior to the procedure, reviewed the expected small wound. Also reviewed the risk of leaving a small scar and the small risk of infection.  PROCEDURE - The areas were prepped with isopropyl alcohol. A small amount of lidocaine 1% with epinephrine was injected at the base of each lesion to achieve good local anesthesia. The skin tags were removed using a snip technique. Aluminum chloride was used for hemostasis. Petrolatum and a bandage were applied. The procedure was tolerated well. - Wound care was reviewed with the patient. They were advised to call with any concerns. Total number of treated acrochordons 4  Return if symptoms worsen or fail to improve.  Graciella Belton, RMA, am acting as scribe for Forest Gleason, MD .  Documentation: I have reviewed the above documentation for accuracy and completeness, and I agree with the above.  Forest Gleason, MD

## 2021-02-02 NOTE — Patient Instructions (Addendum)
Intertrigo is a chronic recurrent rash that occurs in skin fold areas that may be associated with friction; heat; moisture; yeast; fungus; and bacteria.  It is exacerbated by increased movement / activity; sweating; and higher atmospheric temperature.  Start ketoconazole 2% cream twice daily for rash as needed. Start HC 2.5% cream twice daily for up to 2 weeks as needed for rash.   If ketoconazole cream not covered, may substitute over the counter clotrimazole twice daily.   Recommend Zeasorb AF powder.  Instructions for [facility] Medications  One or more of your medications was sent to the [facility] mail order compounding pharmacy. You will receive an email from them and can purchase the medicine through that link. It will then be mailed to your home at the address you confirmed. If for any reason you do not receive an email from them, please check your spam folder. If you still do not find the email, please let us know. [facility] phone number is [phone].  If you have any questions or concerns for your doctor, please call our main line at [phone] and press option 4 to reach your doctor's medical assistant. If no one answers, please leave a voicemail as directed and we will return your call as soon as possible. Messages left after 4 pm will be answered the following business day.   You may also send [place] a message via [place]. We typically respond to MyChart messages within 1-2 business days.  For prescription refills, please ask your pharmacy to contact our office. Our fax number is [phone].  If you have an urgent issue when the clinic is closed that cannot wait until the next business day, you can page your doctor at the number below.    Please note that while we do our best to be available for urgent issues outside of office hours, we are not available 24/7.   If you have an urgent issue and are unable to reach [place], you may choose to seek medical care at your  doctor's office, retail clinic, urgent care center, or emergency room.  If you have a medical emergency, please immediately call 911 or go to the emergency department.  Pager Numbers  - Dr. Nehemiah Massed: [phone]  - Dr. Laurence Ferrari: [phone]  - Dr. Nicole Kindred: [phone]  In the event of inclement weather, please call our main line at [phone] for an update on the status of any delays or closures.  Dermatology Medication Tips: Please keep the boxes that topical medications come in in order to help keep track of the instructions about where and how to use these. Pharmacies typically print the medication instructions only on the boxes and not directly on the medication tubes.   If your medication is too expensive, please contact our office at [phone] option 4 or send [place] a message through [place].   We are unable to tell what your co-pay for medications will be in advance as this is different depending on your insurance coverage. However, we may be able to find a substitute medication at lower cost or fill out paperwork to get insurance to cover a needed medication.   If a prior authorization is required to get your medication covered by your insurance company, please allow [place] 1-2 business days to complete this process.  Drug prices often vary depending on where the prescription is filled and some pharmacies may offer cheaper prices.  The website www.goodrx.com contains coupons for medications through different pharmacies. The prices here do not account  for what the cost may be with help from insurance (it may be cheaper with your insurance), but the website can give you the price if you did not use any insurance.  - You can print the associated coupon and take it with your prescription to the pharmacy.  - You may also stop by our office during regular business hours and pick up a GoodRx coupon card.  - If you need your prescription sent electronically to a different pharmacy, notify  our office through [facility] or by phone at [phone] option 4.

## 2021-02-03 ENCOUNTER — Telehealth: Payer: Self-pay

## 2021-02-03 ENCOUNTER — Other Ambulatory Visit: Payer: Self-pay

## 2021-02-03 DIAGNOSIS — Z03818 Encounter for observation for suspected exposure to other biological agents ruled out: Secondary | ICD-10-CM | POA: Diagnosis not present

## 2021-02-03 DIAGNOSIS — B9689 Other specified bacterial agents as the cause of diseases classified elsewhere: Secondary | ICD-10-CM | POA: Diagnosis not present

## 2021-02-03 DIAGNOSIS — U071 COVID-19: Secondary | ICD-10-CM | POA: Diagnosis not present

## 2021-02-03 DIAGNOSIS — J329 Chronic sinusitis, unspecified: Secondary | ICD-10-CM | POA: Diagnosis not present

## 2021-02-03 MED ORDER — LAGEVRIO 200 MG PO CAPS
ORAL_CAPSULE | ORAL | 0 refills | Status: DC
  Filled 2021-02-03: qty 40, 5d supply, fill #0

## 2021-02-03 MED ORDER — BENZONATATE 200 MG PO CAPS
ORAL_CAPSULE | ORAL | 0 refills | Status: DC
  Filled 2021-02-03: qty 21, 7d supply, fill #0

## 2021-02-03 MED ORDER — PREDNISONE 10 MG PO TABS
ORAL_TABLET | ORAL | 0 refills | Status: DC
  Filled 2021-02-03: qty 5, 5d supply, fill #0

## 2021-02-03 MED ORDER — DOXYCYCLINE HYCLATE 100 MG PO CAPS
100.0000 mg | ORAL_CAPSULE | Freq: Two times a day (BID) | ORAL | 0 refills | Status: DC
  Filled 2021-02-03: qty 14, 7d supply, fill #0

## 2021-02-03 NOTE — Telephone Encounter (Signed)
Patient called and left a voicemail on the nurse line that she went to the urgent care this morning to be seen and has tested positive for COVID. She does apologize but wanted to call and let you know since she was just in the office.

## 2021-02-03 NOTE — Telephone Encounter (Signed)
Please tell patient I appreciate the update and hope she feels better soon!

## 2021-02-04 NOTE — Telephone Encounter (Signed)
Unable to leave message as voicemail box is full.

## 2021-02-08 ENCOUNTER — Encounter: Payer: Self-pay | Admitting: Dermatology

## 2021-02-09 ENCOUNTER — Other Ambulatory Visit: Payer: Self-pay

## 2021-02-09 DIAGNOSIS — R739 Hyperglycemia, unspecified: Secondary | ICD-10-CM | POA: Diagnosis not present

## 2021-02-09 DIAGNOSIS — F331 Major depressive disorder, recurrent, moderate: Secondary | ICD-10-CM | POA: Insufficient documentation

## 2021-02-09 DIAGNOSIS — I10 Essential (primary) hypertension: Secondary | ICD-10-CM | POA: Diagnosis not present

## 2021-02-09 DIAGNOSIS — Z0000 Encounter for general adult medical examination without abnormal findings: Secondary | ICD-10-CM | POA: Diagnosis not present

## 2021-02-09 DIAGNOSIS — E7800 Pure hypercholesterolemia, unspecified: Secondary | ICD-10-CM | POA: Diagnosis not present

## 2021-02-09 DIAGNOSIS — Z79899 Other long term (current) drug therapy: Secondary | ICD-10-CM | POA: Diagnosis not present

## 2021-02-09 MED ORDER — ALPRAZOLAM 0.25 MG PO TABS
ORAL_TABLET | ORAL | 5 refills | Status: DC
  Filled 2021-02-09: qty 60, 15d supply, fill #0
  Filled 2021-04-08: qty 60, 15d supply, fill #1
  Filled 2021-05-06: qty 60, 15d supply, fill #2
  Filled 2021-07-12: qty 60, 15d supply, fill #3
  Filled 2021-08-05: qty 60, 15d supply, fill #4

## 2021-02-11 ENCOUNTER — Telehealth: Payer: Self-pay

## 2021-02-11 NOTE — Telephone Encounter (Signed)
Left message on voicemail informing patient of Dr. Robbi Garter message.

## 2021-02-24 DIAGNOSIS — H401131 Primary open-angle glaucoma, bilateral, mild stage: Secondary | ICD-10-CM | POA: Diagnosis not present

## 2021-03-02 ENCOUNTER — Other Ambulatory Visit: Payer: Self-pay

## 2021-03-02 MED ORDER — ETODOLAC 400 MG PO TABS
ORAL_TABLET | Freq: Two times a day (BID) | ORAL | 3 refills | Status: AC
  Filled 2021-03-02: qty 180, 90d supply, fill #0
  Filled 2021-06-16: qty 180, 90d supply, fill #1
  Filled 2021-10-14: qty 180, 90d supply, fill #2

## 2021-03-03 ENCOUNTER — Other Ambulatory Visit: Payer: Self-pay

## 2021-03-03 MED ORDER — ROSUVASTATIN CALCIUM 10 MG PO TABS
ORAL_TABLET | Freq: Every day | ORAL | 4 refills | Status: DC
  Filled 2021-03-03: qty 90, 90d supply, fill #0
  Filled 2021-06-16: qty 90, 90d supply, fill #1
  Filled 2021-10-28: qty 90, 90d supply, fill #2
  Filled 2022-02-02: qty 90, 90d supply, fill #3

## 2021-03-14 DIAGNOSIS — I10 Essential (primary) hypertension: Secondary | ICD-10-CM | POA: Diagnosis not present

## 2021-03-14 DIAGNOSIS — E7800 Pure hypercholesterolemia, unspecified: Secondary | ICD-10-CM | POA: Diagnosis not present

## 2021-03-22 ENCOUNTER — Other Ambulatory Visit: Payer: Self-pay | Admitting: Internal Medicine

## 2021-03-22 DIAGNOSIS — Z1231 Encounter for screening mammogram for malignant neoplasm of breast: Secondary | ICD-10-CM

## 2021-03-23 ENCOUNTER — Other Ambulatory Visit: Payer: Self-pay

## 2021-03-23 ENCOUNTER — Ambulatory Visit
Admission: RE | Admit: 2021-03-23 | Discharge: 2021-03-23 | Disposition: A | Discharge status: Home / Self Care | Payer: 59 | Source: Ambulatory Visit | Attending: Internal Medicine | Admitting: Internal Medicine

## 2021-03-23 DIAGNOSIS — Z1231 Encounter for screening mammogram for malignant neoplasm of breast: Secondary | ICD-10-CM | POA: Insufficient documentation

## 2021-03-27 MED FILL — Sertraline HCl Tab 100 MG: ORAL | 90 days supply | Qty: 90 | Fill #2 | Status: AC

## 2021-03-27 MED FILL — Dorzolamide HCl-Timolol Maleate Ophth Soln 2-0.5%: OPHTHALMIC | 60 days supply | Qty: 10 | Fill #3 | Status: AC

## 2021-03-28 ENCOUNTER — Other Ambulatory Visit: Payer: Self-pay

## 2021-03-29 ENCOUNTER — Other Ambulatory Visit: Payer: Self-pay

## 2021-03-31 ENCOUNTER — Other Ambulatory Visit: Payer: Self-pay

## 2021-03-31 DIAGNOSIS — R040 Epistaxis: Secondary | ICD-10-CM | POA: Diagnosis not present

## 2021-03-31 DIAGNOSIS — H6123 Impacted cerumen, bilateral: Secondary | ICD-10-CM | POA: Diagnosis not present

## 2021-03-31 DIAGNOSIS — J301 Allergic rhinitis due to pollen: Secondary | ICD-10-CM | POA: Diagnosis not present

## 2021-03-31 MED ORDER — FLUTICASONE PROPIONATE 50 MCG/ACT NA SUSP
2.0000 | Freq: Every day | NASAL | 12 refills | Status: DC
  Filled 2021-03-31: qty 16, 30d supply, fill #0

## 2021-04-06 ENCOUNTER — Other Ambulatory Visit: Payer: Self-pay

## 2021-04-06 DIAGNOSIS — Z3041 Encounter for surveillance of contraceptive pills: Secondary | ICD-10-CM

## 2021-04-06 MED ORDER — NORETHINDRONE 0.35 MG PO TABS
1.0000 | ORAL_TABLET | Freq: Every day | ORAL | 0 refills | Status: DC
  Filled 2021-04-06: qty 84, 84d supply, fill #0

## 2021-04-06 NOTE — Telephone Encounter (Signed)
Pt calling for refill of her bcp; has appt 05/30/21 [facility].  [phone]  Left detailed msg refill sent in.

## 2021-04-08 ENCOUNTER — Other Ambulatory Visit: Payer: Self-pay

## 2021-04-16 MED FILL — Valsartan Tab 160 MG: ORAL | 90 days supply | Qty: 90 | Fill #2 | Status: AC

## 2021-04-18 ENCOUNTER — Other Ambulatory Visit: Payer: Self-pay

## 2021-04-22 ENCOUNTER — Other Ambulatory Visit: Payer: Self-pay

## 2021-04-22 MED FILL — Topiramate Tab 100 MG: ORAL | 90 days supply | Qty: 90 | Fill #2 | Status: AC

## 2021-05-06 ENCOUNTER — Other Ambulatory Visit: Payer: Self-pay

## 2021-05-09 ENCOUNTER — Other Ambulatory Visit: Payer: Self-pay

## 2021-05-09 MED ORDER — METHYLPHENIDATE HCL 20 MG PO TABS
ORAL_TABLET | ORAL | 0 refills | Status: DC
  Filled 2021-05-09: qty 180, 90d supply, fill #0

## 2021-05-10 ENCOUNTER — Other Ambulatory Visit: Payer: Self-pay

## 2021-05-11 DIAGNOSIS — Z23 Encounter for immunization: Secondary | ICD-10-CM | POA: Diagnosis not present

## 2021-05-16 DIAGNOSIS — Z111 Encounter for screening for respiratory tuberculosis: Secondary | ICD-10-CM | POA: Diagnosis not present

## 2021-05-23 DIAGNOSIS — Z111 Encounter for screening for respiratory tuberculosis: Secondary | ICD-10-CM | POA: Diagnosis not present

## 2021-05-23 DIAGNOSIS — Z020 Encounter for examination for admission to educational institution: Secondary | ICD-10-CM | POA: Diagnosis not present

## 2021-05-25 ENCOUNTER — Other Ambulatory Visit: Payer: Self-pay

## 2021-05-30 ENCOUNTER — Ambulatory Visit: Payer: 59 | Admitting: Obstetrics & Gynecology

## 2021-06-03 ENCOUNTER — Other Ambulatory Visit: Payer: Self-pay

## 2021-06-03 DIAGNOSIS — M542 Cervicalgia: Secondary | ICD-10-CM | POA: Diagnosis not present

## 2021-06-03 DIAGNOSIS — G43019 Migraine without aura, intractable, without status migrainosus: Secondary | ICD-10-CM | POA: Diagnosis not present

## 2021-06-03 DIAGNOSIS — G4733 Obstructive sleep apnea (adult) (pediatric): Secondary | ICD-10-CM | POA: Diagnosis not present

## 2021-06-03 DIAGNOSIS — G5603 Carpal tunnel syndrome, bilateral upper limbs: Secondary | ICD-10-CM | POA: Diagnosis not present

## 2021-06-03 MED ORDER — NURTEC 75 MG PO TBDP
ORAL_TABLET | ORAL | 5 refills | Status: DC
Start: 2021-06-03 — End: 2022-07-04
  Filled 2021-06-03: qty 16, 30d supply, fill #0
  Filled 2021-07-24: qty 16, 30d supply, fill #1
  Filled 2021-12-11: qty 16, 30d supply, fill #2
  Filled 2022-03-03 – 2022-03-14 (×2): qty 16, 30d supply, fill #3
  Filled 2022-04-28: qty 16, 30d supply, fill #4

## 2021-06-07 ENCOUNTER — Other Ambulatory Visit: Payer: Self-pay

## 2021-06-07 DIAGNOSIS — R739 Hyperglycemia, unspecified: Secondary | ICD-10-CM | POA: Diagnosis not present

## 2021-06-07 DIAGNOSIS — R634 Abnormal weight loss: Secondary | ICD-10-CM | POA: Diagnosis not present

## 2021-06-07 DIAGNOSIS — Z114 Encounter for screening for human immunodeficiency virus [HIV]: Secondary | ICD-10-CM | POA: Diagnosis not present

## 2021-06-07 DIAGNOSIS — F439 Reaction to severe stress, unspecified: Secondary | ICD-10-CM | POA: Diagnosis not present

## 2021-06-08 DIAGNOSIS — H401131 Primary open-angle glaucoma, bilateral, mild stage: Secondary | ICD-10-CM | POA: Diagnosis not present

## 2021-06-16 ENCOUNTER — Other Ambulatory Visit: Payer: Self-pay

## 2021-06-16 ENCOUNTER — Encounter: Payer: Self-pay | Admitting: Obstetrics & Gynecology

## 2021-06-16 ENCOUNTER — Ambulatory Visit (INDEPENDENT_AMBULATORY_CARE_PROVIDER_SITE_OTHER): Payer: 59 | Admitting: Obstetrics & Gynecology

## 2021-06-16 ENCOUNTER — Other Ambulatory Visit (HOSPITAL_COMMUNITY)
Admission: RE | Admit: 2021-06-16 | Discharge: 2021-06-16 | Disposition: A | Discharge status: Home / Self Care | Payer: 59 | Source: Ambulatory Visit | Attending: Obstetrics & Gynecology | Admitting: Obstetrics & Gynecology

## 2021-06-16 ENCOUNTER — Ambulatory Visit: Payer: 59 | Admitting: Obstetrics & Gynecology

## 2021-06-16 VITALS — BP 130/80 | Ht 60.0 in | Wt 174.0 lb

## 2021-06-16 DIAGNOSIS — Z113 Encounter for screening for infections with a predominantly sexual mode of transmission: Secondary | ICD-10-CM | POA: Diagnosis not present

## 2021-06-16 DIAGNOSIS — Z3041 Encounter for surveillance of contraceptive pills: Secondary | ICD-10-CM | POA: Diagnosis not present

## 2021-06-16 DIAGNOSIS — N870 Mild cervical dysplasia: Secondary | ICD-10-CM

## 2021-06-16 DIAGNOSIS — D219 Benign neoplasm of connective and other soft tissue, unspecified: Secondary | ICD-10-CM

## 2021-06-16 DIAGNOSIS — Z01419 Encounter for gynecological examination (general) (routine) without abnormal findings: Secondary | ICD-10-CM | POA: Diagnosis not present

## 2021-06-16 MED ORDER — OMEPRAZOLE 20 MG PO CPDR
DELAYED_RELEASE_CAPSULE | Freq: Two times a day (BID) | ORAL | 1 refills | Status: DC
  Filled 2021-06-16: qty 180, 90d supply, fill #0
  Filled 2021-09-22: qty 180, 90d supply, fill #1

## 2021-06-16 MED ORDER — NORETHINDRONE 0.35 MG PO TABS
1.0000 | ORAL_TABLET | Freq: Every day | ORAL | 3 refills | Status: DC
  Filled 2021-06-16: qty 84, 84d supply, fill #0
  Filled 2021-09-02: qty 84, 84d supply, fill #1
  Filled 2021-12-15: qty 84, 84d supply, fill #2
  Filled 2022-02-24: qty 84, 84d supply, fill #3

## 2021-06-16 NOTE — Progress Notes (Signed)
HPI:      Olivia Sanchez is a 56 y.o. G0P0000 who LMP was No LMP recorded. (Menstrual status: Irregular Periods)., she presents today for her annual examination. The patient has no complaints today. The patient is not currently sexually active. Her last pap: approximate date 2022 and was normal and has h/o CIN I 2019; and last mammogram: approximate date 2023 and was normal. The patient does perform self breast exams.  There is no notable family history of breast or ovarian cancer in her family.  The patient has regular exercise: yes.  The patient denies current symptoms of depression.    GYN History: Contraception: abstinence  PMHx: Past Medical History:  Diagnosis Date   Arthritis    Depression    Esophageal reflux    Family history of adverse reaction to anesthesia    maternal aunt and maternal uncle and aunts daughter have malignant hyperthermia   Fibroids    Glaucoma    Herpes genitalia    Hypercholesteremia    Hypertension    Malignant hyperthermia    HUGE FAMILY HISTORY OF MH-PTS MATERNAL AUNT AND UNCLE AND COUSIN-PT HAS NEVER HAD ANY ISSUES BUT SHE HAS ONLY HAD COLONOSCOPY AND EGD   Thyroid disease    Past Surgical History:  Procedure Laterality Date   COLONOSCOPY  02/2014   Dr.Elliott   CYSTOSCOPY     EAR CYST EXCISION Left 04/21/2019   Procedure: EXCISION OF CYST FROM LEFT KNEE;  Surgeon: Donato Heinz, MD;  Location: [facility] ORS;  Service: Orthopedics;  Laterality: Left;   ESOPHAGOGASTRODUODENOSCOPY     Family History  Problem Relation Age of Onset   Dementia Mother        senile, early stages   Hypertension Mother    Breast cancer Maternal Aunt 43   Social History   Tobacco Use   Smoking status: Never   Smokeless tobacco: Never  Vaping Use   Vaping Use: Never used  Substance Use Topics   Alcohol use: Not Currently   Drug use: No    Current Outpatient Medications:    acetaminophen (TYLENOL) 650 MG CR tablet, Take 1,300 mg by mouth every 8 (eight)  hours as needed for pain., Disp: , Rfl:    ALPRAZolam (XANAX) 0.25 MG tablet, Take 0.25 mg by mouth daily as needed for anxiety or sleep. , Disp: , Rfl: 2   ALPRAZolam (XANAX) 0.25 MG tablet, Take 1 tablet (0.25 mg total) by mouth every 6 (six) hours as needed for Anxiety, Disp: 60 tablet, Rfl: 5   Aspirin-Caffeine (BC FAST PAIN RELIEF PO), Take 1 packet by mouth 2 (two) times daily as needed (pain)., Disp: , Rfl:    benzonatate (TESSALON) 200 MG capsule, Take 1 capsule (200 mg total) by mouth 3 (three) times daily as needed for Cough for up to 7 days, Disp: 21 capsule, Rfl: 0   Biotin w/ Vitamins C & E (HAIR SKIN & NAILS GUMMIES PO), Take 2 capsules by mouth daily., Disp: , Rfl:    buPROPion (WELLBUTRIN SR) 150 MG 12 hr tablet, Take 300 mg by mouth every morning. , Disp: , Rfl:    buPROPion (WELLBUTRIN XL) 300 MG 24 hr tablet, TAKE 1 TABLET BY MOUTH ONCE DAILY, Disp: 90 tablet, Rfl: 2   buPROPion (WELLBUTRIN XL) 300 MG 24 hr tablet, Take 1 tablet (300 mg total) by mouth once daily, Disp: 90 tablet, Rfl: 2   Cholecalciferol (VITAMIN D) 50 MCG (2000 UT) tablet, Take 2,000 Units  by mouth daily., Disp: , Rfl:    COVID-19 At Home Antigen Test (CARESTART COVID-19 HOME TEST) KIT, use as directed, Disp: 2 kit, Rfl: 0   Dermatological Products, Misc. (NUVAIL) SOLN, Apply 1 application topically daily. Apply to toenails, Disp: , Rfl:    doxycycline (VIBRAMYCIN) 100 MG capsule, Take 1 capsule (100 mg total) by mouth 2 (two) times daily for 7 days, Disp: 14 capsule, Rfl: 0   etodolac (LODINE) 400 MG tablet, Take 400 mg by mouth daily. , Disp: , Rfl:    etodolac (LODINE) 400 MG tablet, TAKE 1 TABLET BY MOUTH 2 TIMES DAILY, Disp: 180 tablet, Rfl: 3   fluticasone (FLONASE) 50 MCG/ACT nasal spray, 2 sprays in each nostril once daily, Disp: 16 g, Rfl: 12   HYDROcodone-acetaminophen (NORCO) 5-325 MG tablet, Take 1-2 tablets by mouth every 4 (four) hours as needed for moderate pain., Disp: 10 tablet, Rfl: 0    hydrocortisone 2.5 % cream, Apply topically 2 (two) times daily as needed (Rash). For up to 2 weeks at a time as needed for rash., Disp: 28 g, Rfl: 2   latanoprost (XALATAN) 0.005 % ophthalmic solution, Place 1 drop into both eyes daily., Disp: , Rfl:    latanoprost (XALATAN) 0.005 % ophthalmic solution, INSTILL ONE DROP INTO BOTH EYES EVERY NIGHT AT BEDTIME, Disp: 2.5 mL, Rfl: 3   methocarbamol (ROBAXIN) 500 MG tablet, Take 500 mg by mouth 2 (two) times daily. , Disp: , Rfl:    methocarbamol (ROBAXIN) 500 MG tablet, TAKE 1 TABLET BY MOUTH 2 TIMES DAILY, Disp: 180 tablet, Rfl: 3   methylphenidate (RITALIN) 20 MG tablet, Take 20 mg by mouth 2 (two) times daily., Disp: , Rfl:    methylphenidate (RITALIN) 20 MG tablet, Take 1 tablet (20 mg total) by mouth 2 (two) times daily for 30 days, Disp: 180 tablet, Rfl: 0   methylphenidate (RITALIN) 20 MG tablet, Take 1 tablet (20 mg total) by mouth 2 (two) times daily for 30 days, Disp: 180 tablet, Rfl: 0   molnupiravir EUA (LAGEVRIO) 200 MG CAPS capsule, Take 4 capsules (800 mg total) by mouth 2 (two) times daily for 5 days, Disp: 40 capsule, Rfl: 0   omeprazole (PRILOSEC) 20 MG capsule, Take 20 mg by mouth every morning. , Disp: , Rfl:    omeprazole (PRILOSEC) 20 MG capsule, TAKE 1 CAPSULE BY MOUTH TWICE DAILY, Disp: 180 capsule, Rfl: 1   predniSONE (DELTASONE) 10 MG tablet, Take 1 tablet (10 mg total) by mouth once daily for 5 days, Disp: 5 tablet, Rfl: 0   propranolol ER (INDERAL LA) 60 MG 24 hr capsule, Take 60 mg by mouth every morning. , Disp: , Rfl: 11   propranolol ER (INDERAL LA) 60 MG 24 hr capsule, TAKE 1 CAPSULE BY MOUTH ONCE DAILY, Disp: 30 capsule, Rfl: 11   propranolol ER (INDERAL LA) 60 MG 24 hr capsule, Take 1 capsule (60 mg total) by mouth once daily, Disp: 90 capsule, Rfl: 3   Rimegepant Sulfate (NURTEC) 75 MG TBDP, Take 1 tablet (75 mg total) by mouth as directed 1 tablet as needed for migraine relief, Disp: 16 tablet, Rfl: 6   Rimegepant  Sulfate (NURTEC) 75 MG TBDP, Take 1 tablet (75 mg total) by mouth every other day 1 tablet (75 mg) every other day for migraine prevention. Maximum dose in a 24 hour period is 75 mg., Disp: 16 tablet, Rfl: 5   rosuvastatin (CRESTOR) 10 MG tablet, TAKE 1 TABLET BY MOUTH ONCE DAILY,  Disp: 90 tablet, Rfl: 4   sertraline (ZOLOFT) 100 MG tablet, Take 100 mg by mouth every morning. Take with 50 mg to equal 150 mg daily, Disp: , Rfl: 5   sertraline (ZOLOFT) 100 MG tablet, TAKE 1 TABLET BY MOUTH ONCE DAILY, Disp: 90 tablet, Rfl: 3   sertraline (ZOLOFT) 50 MG tablet, Take 50 mg by mouth daily. Take with 100 mg to equal 150 mg daily, Disp: , Rfl:    simvastatin (ZOCOR) 40 MG tablet, Take 40 mg by mouth every morning. , Disp: , Rfl: 3   SUMAtriptan (IMITREX) 100 MG tablet, Take 1 tablet (100 mg total) by mouth as directed for Migraine May take a second dose after 2 hours if needed., Disp: 30 tablet, Rfl: 3   topiramate (TOPAMAX) 100 MG tablet, TAKE 1 TABLET BY MOUTH ONCE DAILY PRIOR TO SLEEP IN ADDITION TO 50 MG TABLET AFTER WAKING., Disp: 90 tablet, Rfl: 3   topiramate (TOPAMAX) 100 MG tablet, Take 1 tablet (100 mg total) by mouth once daily for 90 days prior to sleep.  In addition to 50 mg tablet after waking., Disp: 90 tablet, Rfl: 3   topiramate (TOPAMAX) 50 MG tablet, Take 50 mg by mouth 2 (two) times daily., Disp: , Rfl:    topiramate (TOPAMAX) 50 MG tablet, Take 1 tablet (50 mg total) by mouth once daily for 90 days after waking. In addition to 100 mg tablet prior to rest., Disp: 90 tablet, Rfl: 3   traMADol (ULTRAM) 50 MG tablet, Take 1 tablet (50 mg total) by mouth once daily as needed for Pain, Disp: 30 tablet, Rfl: 0   valsartan (DIOVAN) 160 MG tablet, Take 160 mg by mouth every morning. , Disp: , Rfl:    valsartan (DIOVAN) 160 MG tablet, TAKE 1 TABLET BY MOUTH ONCE DAILY, Disp: 90 tablet, Rfl: 3   VYTONE 1-1.9 % CREA, Apply 1 application topically daily. Apply to under breast and lower abdomen, Disp:  , Rfl:    zaleplon (SONATA) 10 MG capsule, Take 10 mg by mouth daily. , Disp: , Rfl: 5   buPROPion (WELLBUTRIN XL) 300 MG 24 hr tablet, TAKE 1 TABLET BY MOUTH ONCE DAILY, Disp: 30 tablet, Rfl: 5   methylphenidate (RITALIN) 20 MG tablet, TAKE 1 TABLET BY MOUTH TWO TIMES DAILY, Disp: 60 tablet, Rfl: 0   methylphenidate (RITALIN) 20 MG tablet, TAKE 1 TABLET BY MOUTH 2 TIMES DAILY, Disp: 60 tablet, Rfl: 0   methylphenidate (RITALIN) 20 MG tablet, TAKE 1 TABLET BY MOUTH TWICE DAILY, Disp: 60 tablet, Rfl: 0   norethindrone (MICRONOR) 0.35 MG tablet, TAKE 1 TABLET (0.35 MG TOTAL) BY MOUTH DAILY., Disp: 84 tablet, Rfl: 3   nortriptyline (PAMELOR) 10 MG capsule, TAKE 1 CAPSULE BY MOUTH AT NIGHT FOR 2 WEEKS, THEN TAKE 2 CAPSULES BY MOUTH AT NIGHT., Disp: 60 capsule, Rfl: 3   propranolol ER (INDERAL LA) 60 MG 24 hr capsule, TAKE 1 CAPSULE BY MOUTH ONCE DAILY, Disp: 90 capsule, Rfl: 3   sertraline (ZOLOFT) 100 MG tablet, TAKE 1 & 1/2 TABLETS BY MOUTH ONCE A DAY, Disp: 45 tablet, Rfl: 5   SUMAtriptan (IMITREX) 100 MG tablet, TAKE 1 TABLET (100 MG TOTAL) BY MOUTH AS DIRECTED FOR MIGRAINE MAY TAKE A SECOND DOSE AFTER 2 HOURS IF NEEDED., Disp: 30 tablet, Rfl: 3   topiramate (TOPAMAX) 50 MG tablet, TAKE 1 TABLET BY MOUTH IN THE MORNING AND 2 TABLETS AT NIGHT, Disp: 270 tablet, Rfl: 3   topiramate (TOPAMAX) 50 MG  tablet, TAKE 1 TABLET BY MOUTH ONCE DAILY FOR 90 DAYS AFTER WAKING. IN ADDITION TO 100 MG TABLET PRIOR TO REST., Disp: 90 tablet, Rfl: 3   zaleplon (SONATA) 10 MG capsule, TAKE 1 CAPSULE BY MOUTH NIGHTLY, Disp: 90 capsule, Rfl: 1   zaleplon (SONATA) 10 MG capsule, TAKE 1 CAPSULE BY MOUTH AT BEDTIME AS NEEDED, Disp: 15 capsule, Rfl: 0 Allergies: Patient has no known allergies.  Review of Systems  Constitutional:  Negative for chills, fever and malaise/fatigue.  HENT:  Negative for congestion, sinus pain and sore throat.   Eyes:  Negative for blurred vision and pain.  Respiratory:  Negative for cough and  wheezing.   Cardiovascular:  Negative for chest pain and leg swelling.  Gastrointestinal:  Negative for abdominal pain, constipation, diarrhea, heartburn, nausea and vomiting.  Genitourinary:  Negative for dysuria, frequency, hematuria and urgency.  Musculoskeletal:  Negative for back pain, joint pain, myalgias and neck pain.  Skin:  Negative for itching and rash.  Neurological:  Negative for dizziness, tremors and weakness.  Endo/Heme/Allergies:  Does not bruise/bleed easily.  Psychiatric/Behavioral:  Negative for depression. The patient is not nervous/anxious and does not have insomnia.    Objective: BP 130/80   Ht 5' (1.524 m)   Wt 174 lb (78.9 kg)   BMI 33.98 kg/m   Filed Weights   06/16/21 1133  Weight: 174 lb (78.9 kg)   Body mass index is 33.98 kg/m. Physical Exam Constitutional:      General: She is not in acute distress.    Appearance: She is well-developed.  Genitourinary:     Bladder, rectum and urethral meatus normal.     No lesions in the vagina.     Right Labia: No rash, tenderness or lesions.    Left Labia: No tenderness, lesions or rash.    No vaginal bleeding.      Right Adnexa: not tender and no mass present.    Left Adnexa: not tender and no mass present.    No cervical motion tenderness, friability, lesion or polyp.     Uterus is not enlarged.     No uterine mass detected.    Pelvic exam was performed with patient in the lithotomy position.  Breasts:    Right: No mass, skin change or tenderness.     Left: No mass, skin change or tenderness.  HENT:     Head: Normocephalic and atraumatic. No laceration.     Right Ear: Hearing normal.     Left Ear: Hearing normal.     Mouth/Throat:     Pharynx: Uvula midline.  Eyes:     Pupils: Pupils are equal, round, and reactive to light.  Neck:     Thyroid: No thyromegaly.  Cardiovascular:     Rate and Rhythm: Normal rate and regular rhythm.     Heart sounds: No murmur heard.   No friction rub. No gallop.   Pulmonary:     Effort: Pulmonary effort is normal. No respiratory distress.     Breath sounds: Normal breath sounds. No wheezing.  Abdominal:     General: Bowel sounds are normal. There is no distension.     Palpations: Abdomen is soft.     Tenderness: There is no abdominal tenderness. There is no rebound.  Musculoskeletal:        General: Normal range of motion.     Cervical back: Normal range of motion and neck supple.  Neurological:     Mental Status: She is alert  and oriented to person, place, and time.     Cranial Nerves: No cranial nerve deficit.  Skin:    General: Skin is warm and dry.  Psychiatric:        Judgment: Judgment normal.  Vitals reviewed.    Assessment:  ANNUAL EXAM 1. Women's annual routine gynecological examination   2. CIN I (cervical intraepithelial neoplasia I)   3. Fibroids   4. Encounter for surveillance of contraceptive pills   5. Screen for STD (sexually transmitted disease)      Screening Plan:            1.  Cervical Screening-  Pap smear done today History of CIN I2019  2. Breast screening- Exam annually and mammogram>40 planned   3. Colonoscopy every 10 years, Hemoccult testing - after age 59  4. Labs managed by PCP  5. Counseling for hormone therapy:  progesterone for bleeding control, fibroid bleeding, prefers to continue as she has used for some time now  6. Fibroids Monitor for s/sx growth or dysfuntion   7. STD testing per pt request   8. Vag d/c- test for BV today    F/U  Return in about 1 year (around 06/17/2022) for and Annual when due.  Annamarie Major, MD, [facility], [facility] 06/16/2021  11:58 AM

## 2021-06-17 ENCOUNTER — Other Ambulatory Visit: Payer: Self-pay

## 2021-06-17 LAB — CERVICOVAGINAL ANCILLARY ONLY
Bacterial Vaginitis (gardnerella): NEGATIVE
Candida Glabrata: NEGATIVE
Candida Vaginitis: NEGATIVE
Comment: NEGATIVE
Comment: NEGATIVE
Comment: NEGATIVE

## 2021-06-17 LAB — HEP, RPR, HIV PANEL
HIV Screen 4th Generation wRfx: NONREACTIVE
Hepatitis B Surface Ag: NEGATIVE
RPR Ser Ql: NONREACTIVE

## 2021-06-17 MED ORDER — ZALEPLON 10 MG PO CAPS
10.0000 mg | ORAL_CAPSULE | Freq: Every day | ORAL | 1 refills | Status: DC
  Filled 2021-06-17: qty 90, 90d supply, fill #0
  Filled 2021-11-11: qty 90, 90d supply, fill #1

## 2021-06-17 MED ORDER — LATANOPROST 0.005 % OP SOLN
OPHTHALMIC | 3 refills | Status: DC
  Filled 2021-06-17: qty 2.5, 18d supply, fill #0
  Filled 2021-07-24: qty 2.5, 18d supply, fill #1
  Filled 2021-08-26: qty 2.5, 18d supply, fill #2
  Filled 2021-09-26: qty 2.5, 18d supply, fill #3

## 2021-06-20 ENCOUNTER — Telehealth: Payer: Self-pay

## 2021-06-20 LAB — CYTOLOGY - PAP
Adequacy: ABSENT
Chlamydia: NEGATIVE
Comment: NEGATIVE
Comment: NEGATIVE
Comment: NORMAL
Diagnosis: NEGATIVE
Neisseria Gonorrhea: NEGATIVE
Trichomonas: NEGATIVE

## 2021-06-20 NOTE — Telephone Encounter (Signed)
Pt calling for pap results, any rxs, still has a yeasty smell.  [phone]  Adv pt her blood work and cultures were negative; pap isn't back yet and it usually takes 7-10 business days. ?

## 2021-06-21 ENCOUNTER — Other Ambulatory Visit: Payer: Self-pay

## 2021-06-21 ENCOUNTER — Other Ambulatory Visit: Payer: Self-pay | Admitting: Obstetrics & Gynecology

## 2021-06-21 MED ORDER — CLOTRIMAZOLE-BETAMETHASONE 1-0.05 % EX CREA
1.0000 "application " | TOPICAL_CREAM | Freq: Two times a day (BID) | CUTANEOUS | 0 refills | Status: AC | PRN
  Filled 2021-06-21: qty 30, 15d supply, fill #0

## 2021-06-21 NOTE — Telephone Encounter (Signed)
Left detailed msg.

## 2021-06-21 NOTE — Telephone Encounter (Signed)
Let her know ALL testing negative (PAP, BV, Yeast, blood work).  OK to use Monistat externally for sometimes external yeast symptoms (or we can call in something).

## 2021-06-21 NOTE — Telephone Encounter (Signed)
Pt calling for rx to be sent in.  [phone] ?

## 2021-06-22 ENCOUNTER — Other Ambulatory Visit: Payer: Self-pay

## 2021-06-24 ENCOUNTER — Other Ambulatory Visit: Payer: Self-pay

## 2021-07-12 ENCOUNTER — Other Ambulatory Visit: Payer: Self-pay

## 2021-07-25 ENCOUNTER — Other Ambulatory Visit: Payer: Self-pay

## 2021-08-05 ENCOUNTER — Other Ambulatory Visit: Payer: Self-pay

## 2021-08-05 MED ORDER — VALSARTAN 160 MG PO TABS
160.0000 mg | ORAL_TABLET | Freq: Every day | ORAL | 3 refills | Status: DC
  Filled 2021-08-05: qty 90, 90d supply, fill #0
  Filled 2021-10-28: qty 90, 90d supply, fill #1
  Filled 2022-02-21: qty 90, 90d supply, fill #2
  Filled 2022-05-24: qty 90, 90d supply, fill #3

## 2021-08-09 ENCOUNTER — Other Ambulatory Visit: Payer: Self-pay

## 2021-08-09 DIAGNOSIS — F411 Generalized anxiety disorder: Secondary | ICD-10-CM | POA: Diagnosis not present

## 2021-08-09 DIAGNOSIS — I10 Essential (primary) hypertension: Secondary | ICD-10-CM | POA: Diagnosis not present

## 2021-08-09 DIAGNOSIS — R739 Hyperglycemia, unspecified: Secondary | ICD-10-CM | POA: Diagnosis not present

## 2021-08-09 DIAGNOSIS — F331 Major depressive disorder, recurrent, moderate: Secondary | ICD-10-CM | POA: Diagnosis not present

## 2021-08-09 DIAGNOSIS — E7800 Pure hypercholesterolemia, unspecified: Secondary | ICD-10-CM | POA: Diagnosis not present

## 2021-08-09 MED ORDER — PROPRANOLOL HCL ER 60 MG PO CP24
ORAL_CAPSULE | ORAL | 3 refills | Status: DC
  Filled 2021-08-09: qty 90, 90d supply, fill #0

## 2021-08-09 MED ORDER — ALPRAZOLAM 0.25 MG PO TABS
ORAL_TABLET | ORAL | 5 refills | Status: DC
  Filled 2021-08-09 – 2021-10-10 (×2): qty 60, 15d supply, fill #0
  Filled 2021-11-11: qty 60, 15d supply, fill #1
  Filled 2021-12-09: qty 60, 15d supply, fill #2

## 2021-08-16 ENCOUNTER — Other Ambulatory Visit: Payer: Self-pay | Admitting: Internal Medicine

## 2021-08-16 DIAGNOSIS — R634 Abnormal weight loss: Secondary | ICD-10-CM

## 2021-08-25 ENCOUNTER — Ambulatory Visit
Admission: RE | Admit: 2021-08-25 | Discharge: 2021-08-25 | Disposition: A | Discharge status: Home / Self Care | Payer: 59 | Source: Ambulatory Visit | Attending: Internal Medicine | Admitting: Internal Medicine

## 2021-08-25 DIAGNOSIS — D1800 Hemangioma unspecified site: Secondary | ICD-10-CM | POA: Diagnosis not present

## 2021-08-25 DIAGNOSIS — D259 Leiomyoma of uterus, unspecified: Secondary | ICD-10-CM | POA: Diagnosis not present

## 2021-08-25 DIAGNOSIS — N852 Hypertrophy of uterus: Secondary | ICD-10-CM | POA: Diagnosis not present

## 2021-08-25 DIAGNOSIS — R634 Abnormal weight loss: Secondary | ICD-10-CM | POA: Diagnosis not present

## 2021-08-25 MED ORDER — IOPAMIDOL (ISOVUE-300) INJECTION 61%
100.0000 mL | Freq: Once | INTRAVENOUS | Status: AC | PRN
  Administered 2021-08-25: 100 mL via INTRAVENOUS

## 2021-08-26 ENCOUNTER — Other Ambulatory Visit: Payer: Self-pay

## 2021-08-28 ENCOUNTER — Other Ambulatory Visit: Payer: Self-pay

## 2021-08-28 MED ORDER — METHYLPHENIDATE HCL 20 MG PO TABS
ORAL_TABLET | ORAL | 0 refills | Status: DC
  Filled 2021-08-28: qty 180, 90d supply, fill #0

## 2021-08-29 ENCOUNTER — Other Ambulatory Visit: Payer: Self-pay

## 2021-08-30 ENCOUNTER — Other Ambulatory Visit: Payer: Self-pay

## 2021-09-02 ENCOUNTER — Other Ambulatory Visit: Payer: Self-pay

## 2021-09-22 ENCOUNTER — Other Ambulatory Visit: Payer: Self-pay

## 2021-09-22 MED ORDER — TOPIRAMATE 50 MG PO TABS
ORAL_TABLET | ORAL | 3 refills | Status: DC
  Filled 2021-09-22 – 2021-11-24 (×2): qty 90, 90d supply, fill #0
  Filled 2022-02-24: qty 90, 90d supply, fill #1
  Filled 2022-06-06: qty 90, 90d supply, fill #2

## 2021-09-22 MED ORDER — TOPIRAMATE 100 MG PO TABS
ORAL_TABLET | ORAL | 3 refills | Status: DC
  Filled 2021-09-22: qty 90, 90d supply, fill #0

## 2021-09-26 ENCOUNTER — Other Ambulatory Visit: Payer: Self-pay

## 2021-09-30 ENCOUNTER — Other Ambulatory Visit: Payer: Self-pay

## 2021-09-30 MED ORDER — PROPRANOLOL HCL ER 60 MG PO CP24
ORAL_CAPSULE | ORAL | 3 refills | Status: DC
  Filled 2021-09-30: qty 90, 90d supply, fill #0
  Filled 2022-01-19: qty 90, 90d supply, fill #1

## 2021-10-10 ENCOUNTER — Other Ambulatory Visit: Payer: Self-pay

## 2021-10-14 ENCOUNTER — Other Ambulatory Visit: Payer: Self-pay

## 2021-10-28 ENCOUNTER — Other Ambulatory Visit: Payer: Self-pay

## 2021-11-11 ENCOUNTER — Other Ambulatory Visit: Payer: Self-pay

## 2021-11-13 ENCOUNTER — Other Ambulatory Visit: Payer: Self-pay

## 2021-11-14 ENCOUNTER — Other Ambulatory Visit: Payer: Self-pay

## 2021-11-15 ENCOUNTER — Other Ambulatory Visit: Payer: Self-pay

## 2021-11-18 ENCOUNTER — Other Ambulatory Visit: Payer: Self-pay

## 2021-11-21 ENCOUNTER — Other Ambulatory Visit: Payer: Self-pay

## 2021-11-21 MED ORDER — LATANOPROST 0.005 % OP SOLN
OPHTHALMIC | 0 refills | Status: DC
  Filled 2021-11-21: qty 2.5, 18d supply, fill #0

## 2021-11-22 ENCOUNTER — Other Ambulatory Visit: Payer: Self-pay

## 2021-11-23 ENCOUNTER — Other Ambulatory Visit: Payer: Self-pay

## 2021-11-24 ENCOUNTER — Other Ambulatory Visit: Payer: Self-pay

## 2021-11-25 ENCOUNTER — Other Ambulatory Visit: Payer: Self-pay

## 2021-12-05 ENCOUNTER — Other Ambulatory Visit: Payer: Self-pay

## 2021-12-05 DIAGNOSIS — M542 Cervicalgia: Secondary | ICD-10-CM | POA: Diagnosis not present

## 2021-12-05 DIAGNOSIS — G43019 Migraine without aura, intractable, without status migrainosus: Secondary | ICD-10-CM | POA: Diagnosis not present

## 2021-12-05 DIAGNOSIS — G5603 Carpal tunnel syndrome, bilateral upper limbs: Secondary | ICD-10-CM | POA: Diagnosis not present

## 2021-12-05 MED ORDER — TOPIRAMATE 50 MG PO TABS
ORAL_TABLET | ORAL | 3 refills | Status: DC
  Filled 2021-12-05: qty 90, 90d supply, fill #0

## 2021-12-05 MED ORDER — TOPIRAMATE 100 MG PO TABS
100.0000 mg | ORAL_TABLET | Freq: Every day | ORAL | 3 refills | Status: DC
  Filled 2021-12-05: qty 90, 90d supply, fill #0
  Filled 2022-02-24: qty 90, 90d supply, fill #1
  Filled 2022-06-06: qty 90, 90d supply, fill #2

## 2021-12-08 DIAGNOSIS — H401131 Primary open-angle glaucoma, bilateral, mild stage: Secondary | ICD-10-CM | POA: Diagnosis not present

## 2021-12-09 ENCOUNTER — Other Ambulatory Visit: Payer: Self-pay

## 2021-12-09 MED ORDER — METHYLPHENIDATE HCL 20 MG PO TABS
ORAL_TABLET | ORAL | 0 refills | Status: DC
  Filled 2021-12-09: qty 80, 40d supply, fill #0
  Filled 2021-12-13: qty 100, 50d supply, fill #0

## 2021-12-11 ENCOUNTER — Other Ambulatory Visit: Payer: Self-pay

## 2021-12-12 ENCOUNTER — Other Ambulatory Visit: Payer: Self-pay

## 2021-12-13 ENCOUNTER — Other Ambulatory Visit: Payer: Self-pay

## 2021-12-15 ENCOUNTER — Other Ambulatory Visit: Payer: Self-pay

## 2021-12-16 ENCOUNTER — Other Ambulatory Visit: Payer: Self-pay

## 2021-12-22 ENCOUNTER — Other Ambulatory Visit (HOSPITAL_COMMUNITY): Payer: Self-pay

## 2021-12-30 ENCOUNTER — Other Ambulatory Visit: Payer: Self-pay

## 2021-12-30 ENCOUNTER — Other Ambulatory Visit: Payer: Self-pay | Admitting: Internal Medicine

## 2021-12-30 DIAGNOSIS — F411 Generalized anxiety disorder: Secondary | ICD-10-CM | POA: Diagnosis not present

## 2021-12-30 DIAGNOSIS — Z79899 Other long term (current) drug therapy: Secondary | ICD-10-CM | POA: Diagnosis not present

## 2021-12-30 DIAGNOSIS — E7800 Pure hypercholesterolemia, unspecified: Secondary | ICD-10-CM | POA: Diagnosis not present

## 2021-12-30 DIAGNOSIS — F331 Major depressive disorder, recurrent, moderate: Secondary | ICD-10-CM | POA: Diagnosis not present

## 2021-12-30 DIAGNOSIS — Z1231 Encounter for screening mammogram for malignant neoplasm of breast: Secondary | ICD-10-CM | POA: Diagnosis not present

## 2021-12-30 DIAGNOSIS — M25511 Pain in right shoulder: Secondary | ICD-10-CM | POA: Diagnosis not present

## 2021-12-30 DIAGNOSIS — R739 Hyperglycemia, unspecified: Secondary | ICD-10-CM | POA: Diagnosis not present

## 2021-12-30 DIAGNOSIS — K219 Gastro-esophageal reflux disease without esophagitis: Secondary | ICD-10-CM | POA: Diagnosis not present

## 2021-12-30 DIAGNOSIS — I10 Essential (primary) hypertension: Secondary | ICD-10-CM | POA: Diagnosis not present

## 2021-12-30 MED ORDER — ETODOLAC 400 MG PO TABS
ORAL_TABLET | ORAL | 3 refills | Status: DC
  Filled 2021-12-30: qty 180, 90d supply, fill #0
  Filled 2022-04-28: qty 180, 90d supply, fill #1

## 2021-12-30 MED ORDER — ALPRAZOLAM 0.25 MG PO TABS
ORAL_TABLET | ORAL | 5 refills | Status: DC
  Filled 2021-12-30: qty 60, 15d supply, fill #0
  Filled 2022-01-24: qty 60, 15d supply, fill #1
  Filled 2022-02-21: qty 60, 15d supply, fill #2
  Filled 2022-03-17: qty 60, 15d supply, fill #3

## 2021-12-30 MED ORDER — BUPROPION HCL ER (XL) 300 MG PO TB24
ORAL_TABLET | ORAL | 2 refills | Status: DC
  Filled 2021-12-30: qty 90, 90d supply, fill #0
  Filled 2022-03-26: qty 90, 90d supply, fill #1

## 2021-12-30 MED ORDER — OMEPRAZOLE 20 MG PO CPDR
DELAYED_RELEASE_CAPSULE | ORAL | 1 refills | Status: DC
  Filled 2021-12-30: qty 180, 90d supply, fill #0
  Filled 2022-04-26: qty 180, 90d supply, fill #1

## 2021-12-30 MED ORDER — ZALEPLON 10 MG PO CAPS
10.0000 mg | ORAL_CAPSULE | Freq: Every evening | ORAL | 1 refills | Status: DC
  Filled 2022-04-26: qty 90, 90d supply, fill #0

## 2021-12-30 MED ORDER — METHOCARBAMOL 500 MG PO TABS
500.0000 mg | ORAL_TABLET | Freq: Two times a day (BID) | ORAL | 3 refills | Status: DC
  Filled 2021-12-30 – 2022-03-17 (×2): qty 180, 90d supply, fill #0

## 2021-12-30 MED ORDER — QUETIAPINE FUMARATE 25 MG PO TABS
ORAL_TABLET | ORAL | 2 refills | Status: DC
  Filled 2021-12-30 – 2022-01-04 (×2): qty 30, 30d supply, fill #0
  Filled 2022-02-02: qty 30, 30d supply, fill #1
  Filled 2022-02-28: qty 30, 30d supply, fill #2

## 2022-01-02 ENCOUNTER — Other Ambulatory Visit: Payer: Self-pay

## 2022-01-04 ENCOUNTER — Other Ambulatory Visit: Payer: Self-pay

## 2022-01-05 ENCOUNTER — Other Ambulatory Visit: Payer: Self-pay

## 2022-01-05 MED ORDER — LATANOPROST 0.005 % OP SOLN
OPHTHALMIC | 2 refills | Status: DC
  Filled 2022-01-05: qty 5, 30d supply, fill #0
  Filled 2022-03-17: qty 5, 30d supply, fill #1
  Filled 2022-05-16: qty 5, 30d supply, fill #2

## 2022-01-06 ENCOUNTER — Other Ambulatory Visit: Payer: Self-pay

## 2022-01-10 ENCOUNTER — Other Ambulatory Visit: Payer: Self-pay

## 2022-01-18 ENCOUNTER — Other Ambulatory Visit: Payer: Self-pay

## 2022-01-18 DIAGNOSIS — M25512 Pain in left shoulder: Secondary | ICD-10-CM | POA: Diagnosis not present

## 2022-01-18 MED ORDER — TRAMADOL HCL 50 MG PO TABS
ORAL_TABLET | ORAL | 0 refills | Status: DC
  Filled 2022-01-18: qty 60, 60d supply, fill #0

## 2022-01-19 ENCOUNTER — Other Ambulatory Visit: Payer: Self-pay

## 2022-01-20 ENCOUNTER — Other Ambulatory Visit: Payer: Self-pay

## 2022-01-20 MED ORDER — SUMATRIPTAN SUCCINATE 100 MG PO TABS
ORAL_TABLET | ORAL | 3 refills | Status: DC
  Filled 2022-01-20: qty 9, 30d supply, fill #0
  Filled 2022-02-21: qty 9, 30d supply, fill #1
  Filled 2022-03-26: qty 9, 30d supply, fill #2
  Filled 2022-05-17: qty 9, 30d supply, fill #3
  Filled 2022-06-15: qty 9, 30d supply, fill #4

## 2022-01-24 ENCOUNTER — Other Ambulatory Visit: Payer: Self-pay

## 2022-02-02 ENCOUNTER — Ambulatory Visit: Payer: 59 | Admitting: Dermatology

## 2022-02-02 ENCOUNTER — Encounter: Payer: Self-pay | Admitting: Dermatology

## 2022-02-02 ENCOUNTER — Other Ambulatory Visit: Payer: Self-pay

## 2022-02-02 DIAGNOSIS — L659 Nonscarring hair loss, unspecified: Secondary | ICD-10-CM | POA: Diagnosis not present

## 2022-02-02 DIAGNOSIS — L304 Erythema intertrigo: Secondary | ICD-10-CM | POA: Diagnosis not present

## 2022-02-02 MED ORDER — PIMECROLIMUS 1 % EX CREA: TOPICAL_CREAM | CUTANEOUS | 3 refills | Status: DC

## 2022-02-02 MED ORDER — KETOCONAZOLE 2 % EX CREA
TOPICAL_CREAM | CUTANEOUS | 3 refills | Status: DC
  Filled 2022-02-02: qty 60, 30d supply, fill #0
  Filled 2022-04-28: qty 60, 30d supply, fill #1
  Filled 2022-06-15: qty 60, 30d supply, fill #2

## 2022-02-02 NOTE — Patient Instructions (Addendum)
Start ketoconazole 2% cream twice daily for rash as needed. Start Pimecrolimus cream twice daily as needed for rash.   Stop Hydrocortisone cream.   Recommend using Zeasorb AF powder daily for maintenance between flares.   Recommend using hair dryer to make sure areas dry well after shower.    Topical steroids (such as triamcinolone, fluocinolone, fluocinonide, mometasone, clobetasol, halobetasol, betamethasone, hydrocortisone) can cause thinning and lightening of the skin if they are used for too long in the same area. Your physician has selected the right strength medicine for your problem and area affected on the body. Please use your medication only as directed by your physician to prevent side effects.   Lichen Planopilaris vs Frontal Fibrosing Scarring Alopecia, Central Centrifugal Cicatricial Alopecia less likely   Due to recent changes in healthcare laws, you may see results of your pathology and/or laboratory studies on MyChart before the doctors have had a chance to review them. We understand that in some cases there may be results that are confusing or concerning to you. Please understand that not all results are received at the same time and often the doctors may need to interpret multiple results in order to provide you with the best plan of care or course of treatment. Therefore, we ask that you please give [place] 2 business days to thoroughly review all your results before contacting the office for clarification. Should we see a critical lab result, you will be contacted sooner.   If You Need Anything After Your Visit  If you have any questions or concerns for your doctor, please call our main line at [phone] and press option 4 to reach your doctor's medical assistant. If no one answers, please leave a voicemail as directed and we will return your call as soon as possible. Messages left after 4 pm will be answered the following business day.   You may also send [place] a message via  [place]. We typically respond to MyChart messages within 1-2 business days.  For prescription refills, please ask your pharmacy to contact our office. Our fax number is [phone].  If you have an urgent issue when the clinic is closed that cannot wait until the next business day, you can page your doctor at the number below.    Please note that while we do our best to be available for urgent issues outside of office hours, we are not available 24/7.   If you have an urgent issue and are unable to reach [place], you may choose to seek medical care at your doctor's office, retail clinic, urgent care center, or emergency room.  If you have a medical emergency, please immediately call 911 or go to the emergency department.  Pager Numbers  - Dr. Nehemiah Massed: [phone]  - Dr. Laurence Ferrari: [phone]  - Dr. Nicole Kindred: [phone]  In the event of inclement weather, please call our main line at [phone] for an update on the status of any delays or closures.  Dermatology Medication Tips: Please keep the boxes that topical medications come in in order to help keep track of the instructions about where and how to use these. Pharmacies typically print the medication instructions only on the boxes and not directly on the medication tubes.   If your medication is too expensive, please contact our office at [phone] option 4 or send [place] a message through [place].   We are unable to tell what your co-pay for medications will be in advance as this is different depending on your insurance coverage.  However, we may be able to find a substitute medication at lower cost or fill out paperwork to get insurance to cover a needed medication.   If a prior authorization is required to get your medication covered by your insurance company, please allow [place] 1-2 business days to complete this process.  Drug prices often vary depending on where the prescription is filled and some pharmacies may offer cheaper  prices.  The website www.goodrx.com contains coupons for medications through different pharmacies. The prices here do not account for what the cost may be with help from insurance (it may be cheaper with your insurance), but the website can give you the price if you did not use any insurance.  - You can print the associated coupon and take it with your prescription to the pharmacy.  - You may also stop by our office during regular business hours and pick up a GoodRx coupon card.  - If you need your prescription sent electronically to a different pharmacy, notify our office through [facility] or by phone at [phone] option 4.     Si Usted Necesita Algo Despus de Su Visita  Tambin puede enviarnos un mensaje a travs de Pharmacist, community. Por lo general respondemos a los mensajes de MyChart en el transcurso de 1 a 2 das hbiles.  Para renovar recetas, por favor pida a su farmacia que se ponga en contacto con nuestra oficina. Harland Dingwall de fax es [place] [phone].  Si tiene un asunto urgente cuando la clnica est cerrada y que no puede esperar hasta el siguiente da hbil, puede llamar/localizar a su doctor(a) al nmero que aparece a continuacin.   Por favor, tenga en cuenta que aunque hacemos todo lo posible para estar disponibles para asuntos urgentes fuera del horario de [employer], no estamos disponibles las 24 horas del da, los 7 das de la [place].   Si tiene un problema urgente y no puede comunicarse con nosotros, puede optar por buscar atencin mdica  en el consultorio de su doctor(a), en una clnica privada, en un centro de atencin urgente o en una sala de emergencias.  Si tiene Engineering geologist, por favor llame inmediatamente al 911 o vaya a la sala de emergencias.  Nmeros de bper  - Dr. Nehemiah Massed: [phone]  - Dra. Moye: [phone]  - Dra. Nicole Kindred: [phone]  En caso de inclemencias del [place], por favor llame a Johnsie Kindred principal al [phone]  para una actualizacin sobre el [place] de cualquier retraso o cierre.  Consejos para la medicacin en dermatologa: Por favor, guarde las cajas en las que vienen los medicamentos de uso tpico para ayudarle a seguir las instrucciones sobre dnde y cmo usarlos. Las farmacias generalmente imprimen las instrucciones del medicamento slo en las cajas y no directamente en los tubos del [place].   Si su medicamento es muy caro, por favor, pngase en contacto con Zigmund Daniel llamando al [phone] y presione la opcin 4 o envenos un mensaje a travs de Pharmacist, community.   No podemos decirle cul ser su copago por los medicamentos por adelantado ya que esto es diferente dependiendo de la cobertura de su seguro. Sin embargo, es posible que podamos encontrar un medicamento sustituto a Electrical engineer un formulario para que el seguro cubra el medicamento que se considera necesario.   Si se requiere una autorizacin previa para que su compaa de seguros [place] su medicamento, por favor permtanos de 1 a 2 das hbiles para completar este proceso.  Los precios de  los medicamentos varan con frecuencia dependiendo del lugar de dnde se surte la receta y alguna farmacias pueden ofrecer precios ms baratos.  El sitio web www.goodrx.com tiene cupones para medicamentos de Airline pilot. Los precios aqu no tienen en cuenta lo que podra costar con la ayuda del seguro (puede ser ms barato con su seguro), pero el sitio web puede darle el precio si no utiliz Research scientist (physical sciences).  - Puede imprimir el cupn correspondiente y llevarlo con su receta a la farmacia.  - Tambin puede pasar por nuestra oficina durante el horario de atencin regular y Charity fundraiser una tarjeta de cupones de GoodRx.  - Si necesita que su receta se enve electrnicamente a una farmacia diferente, informe a nuestra oficina a travs de MyChart de [place] o por telfono llamando al [phone] y presione la opcin 4.

## 2022-02-02 NOTE — Progress Notes (Signed)
   Follow-Up Visit   Subjective  Olivia Sanchez is a 56 y.o. female who presents for the following: Rash (Hx of intertrigo at inframammary. Needs refills of Ketoconazole 2% cream and HC 2.5% cream).    The following portions of the chart were reviewed this encounter and updated as appropriate:  Tobacco  Allergies  Meds  Problems  Med Hx  Surg Hx  Fam Hx      Review of Systems: No other skin or systemic complaints except as noted in HPI or Assessment and Plan.   Objective  Well appearing patient in no apparent distress; mood and affect are within normal limits.  A focused examination was performed including chest. Relevant physical exam findings are noted in the Assessment and Plan.  Inframammary Folds, umbilicus Hyperpigmented patches with striae   Scalp Areas of hair loss with loss of follicular ostia.    Assessment & Plan  Erythema intertrigo Inframammary Folds, umbilicus  Chronic condition with duration or expected duration over one year. Currently well-controlled but with side effects of medication (striae).  Intertrigo is a chronic recurrent rash that occurs in skin fold areas that may be associated with friction; heat; moisture; yeast; fungus; and bacteria.  It is exacerbated by increased movement / activity; sweating; and higher atmospheric temperature.   Continue  ketoconazole 2% cream twice daily for rash as needed. D/c hydrocortisone d/t side effects Start Pimecrolimus cream twice daily as needed for rash.    Recommend using Zeasorb AF powder for maintenance between flares.   Recommend using hair dryer to make sure areas dry well after shower.   ketoconazole (NIZORAL) 2 % cream - Inframammary Folds, umbilicus Apply twice daily to affected areas as needed for rash  pimecrolimus (ELIDEL) 1 % cream - Inframammary Folds, umbilicus Apply twice daily to affected areas as needed for rash  Alopecia Scalp  C/w scarring alopecia  Lichen Planopilaris vs  Frontal Fibrosing Scarring Alopecia favored over Central Centrifugal Cicatricial Alopecia less likely  Recommend biopsy to give best treatment options. Will schedule for next week.     Return for biopsy as scheduled.  I, Emelia Salisbury, CMA, am acting as scribe for Forest Gleason, MD.   Documentation: I have reviewed the above documentation for accuracy and completeness, and I agree with the above.  Forest Gleason, MD

## 2022-02-03 ENCOUNTER — Other Ambulatory Visit: Payer: Self-pay

## 2022-02-03 MED ORDER — METHYLPHENIDATE HCL 20 MG PO TABS
ORAL_TABLET | ORAL | 0 refills | Status: DC
  Filled 2022-02-03: qty 180, 90d supply, fill #0

## 2022-02-06 ENCOUNTER — Encounter: Payer: Self-pay | Admitting: Dermatology

## 2022-02-08 ENCOUNTER — Ambulatory Visit: Payer: 59 | Admitting: Dermatology

## 2022-02-08 ENCOUNTER — Encounter: Payer: Self-pay | Admitting: Dermatology

## 2022-02-08 ENCOUNTER — Other Ambulatory Visit: Payer: Self-pay

## 2022-02-08 DIAGNOSIS — L659 Nonscarring hair loss, unspecified: Secondary | ICD-10-CM

## 2022-02-08 DIAGNOSIS — L668 Other cicatricial alopecia: Secondary | ICD-10-CM | POA: Diagnosis not present

## 2022-02-08 NOTE — Patient Instructions (Addendum)
Wound Care Instructions  Cleanse wound gently with soap and water once a day then pat dry with clean gauze. Apply a thin coat of Petrolatum (petroleum jelly, "Vaseline") over the wound (unless you have an allergy to this). We recommend that you use a new, sterile tube of Vaseline. Do not pick or remove scabs. Do not remove the yellow or white "healing tissue" from the base of the wound.  Cover the wound with fresh, clean, nonstick gauze and secure with paper tape. You may use Band-Aids in place of gauze and tape if the wound is small enough, but would recommend trimming much of the tape off as there is often too much. Sometimes Band-Aids can irritate the skin.  You should call the office for your biopsy report after 1 week if you have not already been contacted.  If you experience any problems, such as abnormal amounts of bleeding, swelling, significant bruising, significant pain, or evidence of infection, please call the office immediately.  FOR ADULT SURGERY PATIENTS: If you need something for pain relief you may take 1 extra strength Tylenol (acetaminophen) AND 2 Ibuprofen (200mg each) together every 4 hours as needed for pain. (do not take these if you are allergic to them or if you have a reason you should not take them.) Typically, you may only need pain medication for 1 to 3 days.     Due to recent changes in healthcare laws, you may see results of your pathology and/or laboratory studies on MyChart before the doctors have had a chance to review them. We understand that in some cases there may be results that are confusing or concerning to you. Please understand that not all results are received at the same time and often the doctors may need to interpret multiple results in order to provide you with the best plan of care or course of treatment. Therefore, we ask that you please give us 2 business days to thoroughly review all your results before contacting the office for clarification. Should  we see a critical lab result, you will be contacted sooner.   If You Need Anything After Your Visit  If you have any questions or concerns for your doctor, please call our main line at [phone] and press option 4 to reach your doctor's medical assistant. If no one answers, please leave a voicemail as directed and we will return your call as soon as possible. Messages left after 4 pm will be answered the following business day.   You may also send us a message via MyChart. We typically respond to MyChart messages within 1-2 business days.  For prescription refills, please ask your pharmacy to contact our office. Our fax number is [phone].  If you have an urgent issue when the clinic is closed that cannot wait until the next business day, you can page your doctor at the number below.    Please note that while we do our best to be available for urgent issues outside of office hours, we are not available 24/7.   If you have an urgent issue and are unable to reach us, you may choose to seek medical care at your doctor's office, retail clinic, urgent care center, or emergency room.  If you have a medical emergency, please immediately call 911 or go to the emergency department.  Pager Numbers  - Dr. Kowalski: [phone]  - Dr. Moye: [phone]  - Dr. Stewart: [phone]  In the event of inclement weather, please call our main line at   [phone] for an update on the status of any delays or closures.  Dermatology Medication Tips: Please keep the boxes that topical medications come in in order to help keep track of the instructions about where and how to use these. Pharmacies typically print the medication instructions only on the boxes and not directly on the medication tubes.   If your medication is too expensive, please contact our office at [phone] option 4 or send us a message through MyChart.   We are unable to tell what your co-pay for medications will be in  advance as this is different depending on your insurance coverage. However, we may be able to find a substitute medication at lower cost or fill out paperwork to get insurance to cover a needed medication.   If a prior authorization is required to get your medication covered by your insurance company, please allow us 1-2 business days to complete this process.  Drug prices often vary depending on where the prescription is filled and some pharmacies may offer cheaper prices.  The website www.goodrx.com contains coupons for medications through different pharmacies. The prices here do not account for what the cost may be with help from insurance (it may be cheaper with your insurance), but the website can give you the price if you did not use any insurance.  - You can print the associated coupon and take it with your prescription to the pharmacy.  - You may also stop by our office during regular business hours and pick up a GoodRx coupon card.  - If you need your prescription sent electronically to a different pharmacy, notify our office through [place] MyChart or by phone at [phone] option 4.     Si Usted Necesita Algo Despus de Su Visita  Tambin puede enviarnos un mensaje a travs de MyChart. Por lo general respondemos a los mensajes de MyChart en el transcurso de 1 a 2 das hbiles.  Para renovar recetas, por favor pida a su farmacia que se ponga en contacto con nuestra oficina. Nuestro nmero de fax es el [phone].  Si tiene un asunto urgente cuando la clnica est cerrada y que no puede esperar hasta el siguiente da hbil, puede llamar/localizar a su doctor(a) al nmero que aparece a continuacin.   Por favor, tenga en cuenta que aunque hacemos todo lo posible para estar disponibles para asuntos urgentes fuera del horario de oficina, no estamos disponibles las 24 horas del da, los 7 das de la semana.   Si tiene un problema urgente y no puede comunicarse con nosotros, puede  optar por buscar atencin mdica  en el consultorio de su doctor(a), en una clnica privada, en un centro de atencin urgente o en una sala de emergencias.  Si tiene una emergencia mdica, por favor llame inmediatamente al 911 o vaya a la sala de emergencias.  Nmeros de bper  - Dr. Kowalski: [phone]  - Dra. Moye: [phone]  - Dra. Stewart: [phone]  En caso de inclemencias del tiempo, por favor llame a nuestra lnea principal al [phone] para una actualizacin sobre el [place] de cualquier retraso o cierre.  Consejos para la medicacin en dermatologa: Por favor, guarde las cajas en las que vienen los medicamentos de uso tpico para ayudarle a seguir las instrucciones sobre dnde y cmo usarlos. Las farmacias generalmente imprimen las instrucciones del medicamento slo en las cajas y no directamente en los tubos del medicamento.   Si su medicamento es muy caro, por favor, pngase en contacto con   nuestra oficina llamando al [phone] y presione la opcin 4 o envenos un mensaje a travs de MyChart.   No podemos decirle cul ser su copago por los medicamentos por adelantado ya que esto es diferente dependiendo de la cobertura de su seguro. Sin embargo, es posible que podamos encontrar un medicamento sustituto a menor costo o llenar un formulario para que el seguro cubra el medicamento que se considera necesario.   Si se requiere una autorizacin previa para que su compaa de seguros cubra su medicamento, por favor permtanos de 1 a 2 das hbiles para completar este proceso.  Los precios de los medicamentos varan con frecuencia dependiendo del lugar de dnde se surte la receta y alguna farmacias pueden ofrecer precios ms baratos.  El sitio web www.goodrx.com tiene cupones para medicamentos de diferentes farmacias. Los precios aqu no tienen en cuenta lo que podra costar con la ayuda del seguro (puede ser ms barato con su seguro), pero el sitio web puede darle el  precio si no utiliz ningn seguro.  - Puede imprimir el cupn correspondiente y llevarlo con su receta a la farmacia.  - Tambin puede pasar por nuestra oficina durante el horario de atencin regular y recoger una tarjeta de cupones de GoodRx.  - Si necesita que su receta se enve electrnicamente a una farmacia diferente, informe a nuestra oficina a travs de MyChart de [place] o por telfono llamando al [phone] y presione la opcin 4.  

## 2022-02-08 NOTE — Progress Notes (Unsigned)
   Follow-Up Visit   Subjective  Olivia Sanchez is a 56 y.o. female who presents for the following: Alopecia (Here for biopsy).  The following portions of the chart were reviewed this encounter and updated as appropriate:  Tobacco  Allergies  Meds  Problems  Med Hx  Surg Hx  Fam Hx      Review of Systems: No other skin or systemic complaints except as noted in HPI or Assessment and Plan.   Objective  Well appearing patient in no apparent distress; mood and affect are within normal limits.  A focused examination was performed including scalp. Relevant physical exam findings are noted in the Assessment and Plan.  right vertex scalp Areas of hair loss with loss of follicular ostia.     Assessment & Plan  Alopecia right vertex scalp  C/w scarring alopecia   Lichen Planopilaris vs Frontal Fibrosing Scarring Alopecia favored over Central Centrifugal Cicatricial Alopecia less likely    Skin / nail biopsy - right vertex scalp Type of biopsy: punch   Informed consent: discussed and consent obtained   Timeout: patient name, date of birth, surgical site, and procedure verified   Procedure prep:  Patient was prepped and draped in usual sterile fashion Prep type:  Isopropyl alcohol Anesthesia: the lesion was anesthetized in a standard fashion   Anesthetic:  1% lidocaine w/ epinephrine 1-100,000 buffered w/ 8.4% NaHCO3 Punch size:  4 mm Suture size:  3-0 Suture type: nylon   Suture type comment:  2 interrupted, 1 horizontal mattress Hemostasis achieved with: suture   Outcome: patient tolerated procedure well   Post-procedure details: sterile dressing applied and wound care instructions given   Dressing type: bacitracin    Specimen 1 - Surgical pathology Differential Diagnosis: Lichen Planopilaris vs Frontal Fibrosing Scarring Alopecia favored > Central Centrifugal Cicatricial Alopecia  Check Margins: No  Please perform vertical and horizontal sections   Return in about  2 weeks (around 02/22/2022) for Suture Removal.  I, Emelia Salisbury, CMA, am acting as scribe for Forest Gleason, MD.  Documentation: I have reviewed the above documentation for accuracy and completeness, and I agree with the above.  Forest Gleason, MD

## 2022-02-09 ENCOUNTER — Encounter: Payer: Self-pay | Admitting: Dermatology

## 2022-02-15 ENCOUNTER — Telehealth: Payer: Self-pay

## 2022-02-15 NOTE — Telephone Encounter (Addendum)
Tried calling patient regarding results. No answer and unable to leave message due to vm full.       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   If she is having any itch, we can add fluocinolone oil to use twice a day to itchy areas of the scalp until follow-up.   MAs please call. Thank you!

## 2022-02-21 ENCOUNTER — Other Ambulatory Visit: Payer: Self-pay

## 2022-02-22 ENCOUNTER — Other Ambulatory Visit: Payer: Self-pay

## 2022-02-22 ENCOUNTER — Encounter: Payer: Self-pay | Admitting: Dermatology

## 2022-02-22 ENCOUNTER — Ambulatory Visit (INDEPENDENT_AMBULATORY_CARE_PROVIDER_SITE_OTHER): Payer: 59 | Admitting: Dermatology

## 2022-02-22 DIAGNOSIS — L905 Scar conditions and fibrosis of skin: Secondary | ICD-10-CM

## 2022-02-22 DIAGNOSIS — L668 Other cicatricial alopecia: Secondary | ICD-10-CM

## 2022-02-22 MED ORDER — DOXYCYCLINE HYCLATE 20 MG PO TABS
20.0000 mg | ORAL_TABLET | Freq: Two times a day (BID) | ORAL | 2 refills | Status: AC
  Filled 2022-02-22: qty 60, 30d supply, fill #0
  Filled 2022-04-28: qty 60, 30d supply, fill #1

## 2022-02-22 MED ORDER — CLOBETASOL PROPIONATE 0.05 % EX SOLN
CUTANEOUS | 2 refills | Status: DC
  Filled 2022-02-22: qty 50, 30d supply, fill #0

## 2022-02-22 MED ORDER — DOXYCYCLINE MONOHYDRATE 100 MG PO CAPS
100.0000 mg | ORAL_CAPSULE | Freq: Two times a day (BID) | ORAL | 0 refills | Status: AC
  Filled 2022-02-22: qty 14, 7d supply, fill #0

## 2022-02-22 NOTE — Patient Instructions (Addendum)
CCCA is a chronic/progressive and irreversible patterned form of scarring alopecia that most commonly affects middle-aged women of African descent that presents with progressive hair loss, starting as a single patch at the vertex of the scalp and then expanding in a centrifugal and symmetrical pattern on the crown. Triggering or aggravation of the disease may occur following traumatic hair care practices, such as cornrows and braiding, extensions, weaves with sewn-in or glued-on hair, use of hot combs, and frequent use of hair relaxers. These practices should be discontinued to slow progression of disease. Therapies may stop or slow progression but generally do not lead to hair regrowth in scarred areas.   Start clobetasol 0.05% solution 1-2 times daily as needed for itch. Avoid applying to face, groin, and axilla. Use as directed. Long-term use can cause thinning of the skin.  Start doxycycline '100mg'$  twice daily with food for 1 week then decrease to '20mg'$  twice daily with food.  Doxycycline should be taken with food to prevent nausea. Do not lay down for 30 minutes after taking. Be cautious with sun exposure and use good sun protection while on this medication. Pregnant women should not take this medication.   Topical steroids (such as triamcinolone, fluocinolone, fluocinonide, mometasone, clobetasol, halobetasol, betamethasone, hydrocortisone) can cause thinning and lightening of the skin if they are used for too long in the same area. Your physician has selected the right strength medicine for your problem and area affected on the body. Please use your medication only as directed by your physician to prevent side effects.   Intralesional steroid injection side effects were reviewed including thinning of the skin and discoloration, such as redness, lightening or darkening.  Due to recent changes in healthcare laws, you may see results of your pathology and/or laboratory studies on MyChart before the  doctors have had a chance to review them. We understand that in some cases there may be results that are confusing or concerning to you. Please understand that not all results are received at the same time and often the doctors may need to interpret multiple results in order to provide you with the best plan of care or course of treatment. Therefore, we ask that you please give [place] 2 business days to thoroughly review all your results before contacting the office for clarification. Should we see a critical lab result, you will be contacted sooner.   If You Need Anything After Your Visit  If you have any questions or concerns for your doctor, please call our main line at [phone] and press option 4 to reach your doctor's medical assistant. If no one answers, please leave a voicemail as directed and we will return your call as soon as possible. Messages left after 4 pm will be answered the following business day.   You may also send [place] a message via [place]. We typically respond to MyChart messages within 1-2 business days.  For prescription refills, please ask your pharmacy to contact our office. Our fax number is [phone].  If you have an urgent issue when the clinic is closed that cannot wait until the next business day, you can page your doctor at the number below.    Please note that while we do our best to be available for urgent issues outside of office hours, we are not available 24/7.   If you have an urgent issue and are unable to reach [place], you may choose to seek medical care at your doctor's office, retail clinic, urgent care center, or  emergency room.  If you have a medical emergency, please immediately call 911 or go to the emergency department.  Pager Numbers  - Dr. Nehemiah Massed: [phone]  - Dr. Laurence Ferrari: [phone]  - Dr. Nicole Kindred: [phone]  In the event of inclement weather, please call our main line at [phone] for an update on the status of any delays or  closures.  Dermatology Medication Tips: Please keep the boxes that topical medications come in in order to help keep track of the instructions about where and how to use these. Pharmacies typically print the medication instructions only on the boxes and not directly on the medication tubes.   If your medication is too expensive, please contact our office at [phone] option 4 or send [place] a message through [place].   We are unable to tell what your co-pay for medications will be in advance as this is different depending on your insurance coverage. However, we may be able to find a substitute medication at lower cost or fill out paperwork to get insurance to cover a needed medication.   If a prior authorization is required to get your medication covered by your insurance company, please allow [place] 1-2 business days to complete this process.  Drug prices often vary depending on where the prescription is filled and some pharmacies may offer cheaper prices.  The website www.goodrx.com contains coupons for medications through different pharmacies. The prices here do not account for what the cost may be with help from insurance (it may be cheaper with your insurance), but the website can give you the price if you did not use any insurance.  - You can print the associated coupon and take it with your prescription to the pharmacy.  - You may also stop by our office during regular business hours and pick up a GoodRx coupon card.  - If you need your prescription sent electronically to a different pharmacy, notify our office through [facility] or by phone at [phone] option 4.     Si Usted Necesita Algo Despus de Su Visita  Tambin puede enviarnos un mensaje a travs de Pharmacist, community. Por lo general respondemos a los mensajes de MyChart en el transcurso de 1 a 2 das hbiles.  Para renovar recetas, por favor pida a su farmacia que se ponga en contacto con nuestra oficina. Harland Dingwall de fax  es [place] [phone].  Si tiene un asunto urgente cuando la clnica est cerrada y que no puede esperar hasta el siguiente da hbil, puede llamar/localizar a su doctor(a) al nmero que aparece a continuacin.   Por favor, tenga en cuenta que aunque hacemos todo lo posible para estar disponibles para asuntos urgentes fuera del horario de [place], no estamos disponibles las 24 horas del da, los 7 das de la [place].   Si tiene un problema urgente y no puede comunicarse con nosotros, puede optar por buscar atencin mdica  en el consultorio de su doctor(a), en una clnica privada, en un centro de atencin urgente o en una sala de emergencias.  Si tiene Engineering geologist, por favor llame inmediatamente al 911 o vaya a la sala de emergencias.  Nmeros de bper  - Dr. Nehemiah Massed: [phone]  - Dra. Moye: [phone]  - Dra. Nicole Kindred: [phone]  En caso de inclemencias del [place], por favor llame a Johnsie Kindred principal al [phone] para una actualizacin sobre el [place] de cualquier retraso o cierre.  Consejos para la medicacin en dermatologa: Por favor, guarde las cajas en las que vienen  los medicamentos de uso tpico para ayudarle a seguir las instrucciones sobre dnde y cmo usarlos. Las farmacias generalmente imprimen las instrucciones del medicamento slo en las cajas y no directamente en los tubos del [place].   Si su medicamento es muy caro, por favor, pngase en contacto con Zigmund Daniel llamando al [phone] y presione la opcin 4 o envenos un mensaje a travs de Pharmacist, community.   No podemos decirle cul ser su copago por los medicamentos por adelantado ya que esto es diferente dependiendo de la cobertura de su seguro. Sin embargo, es posible que podamos encontrar un medicamento sustituto a Electrical engineer un formulario para que el seguro cubra el medicamento que se considera necesario.   Si se requiere una autorizacin previa para que su compaa de seguros [place]  su medicamento, por favor permtanos de 1 a 2 das hbiles para completar este proceso.  Los precios de los medicamentos varan con frecuencia dependiendo del Environmental consultant de dnde se surte la receta y alguna farmacias pueden ofrecer precios ms baratos.  El sitio web www.goodrx.com tiene cupones para medicamentos de Airline pilot. Los precios aqu no tienen en cuenta lo que podra costar con la ayuda del seguro (puede ser ms barato con su seguro), pero el sitio web puede darle el precio si no utiliz Research scientist (physical sciences).  - Puede imprimir el cupn correspondiente y llevarlo con su receta a la farmacia.  - Tambin puede pasar por nuestra oficina durante el horario de atencin regular y Charity fundraiser una tarjeta de cupones de GoodRx.  - Si necesita que su receta se enve electrnicamente a una farmacia diferente, informe a nuestra oficina a travs de MyChart de [place] o por telfono llamando al [phone] y presione la opcin 4.

## 2022-02-22 NOTE — Progress Notes (Signed)
Follow-Up Visit   Subjective  Olivia Sanchez is a 56 y.o. female who presents for the following: Suture / Staple Removal. She notes itch of her scalp and hair thinning.  She notes her hair does not tolerate oils well so she would prefer a solution for her scalp.   The following portions of the chart were reviewed this encounter and updated as appropriate:   Tobacco  Allergies  Meds  Problems  Med Hx  Surg Hx  Fam Hx      Review of Systems:  No other skin or systemic complaints except as noted in HPI or Assessment and Plan.  Objective  Well appearing patient in no apparent distress; mood and affect are within normal limits.  A focused examination was performed including scalp. Relevant physical exam findings are noted in the Assessment and Plan.  Scalp Diffuse loss of follicular ostia  Scalp Areas of hair loss with loss of follicular ostia and perifollicular erythema    Assessment & Plan  Scar conditions and fibrosis of skin Scalp  Intralesional steroid injection side effects were reviewed including thinning of the skin and discoloration, such as redness, lightening or darkening.  [name] [id number]   Intralesional injection - Scalp Location: scalp  Informed Consent: Discussed risks (infection, pain, bleeding, bruising, thinning of the skin, loss of skin pigment, lack of resolution, and recurrence of lesion) and benefits of the procedure, as well as the alternatives. Informed consent was obtained. Preparation: The area was prepared a standard fashion.  Procedure Details: An intralesional injection was performed with Kenalog 2 mg/cc. 3 cc in total were injected.  Total number of injections: >20  Plan: The patient was instructed on post-op care. Recommend OTC analgesia as needed for pain.   clobetasol (TEMOVATE) 0.05 % external solution - Scalp Apply to scalp once to twice a day as needed for itch. Avoid applying to face, groin, and axilla. Use as directed.  Long-term use can cause thinning of the skin.  doxycycline (PERIOSTAT) 20 MG tablet - Scalp Take 1 tablet (20 mg total) by mouth 2 (two) times daily. Take with food  Central centrifugal scarring alopecia Scalp  CCCA is a chronic/progressive and irreversible patterned form of scarring alopecia that most commonly affects middle-aged women of African descent that presents with progressive hair loss, starting as a single patch at the vertex of the scalp and then expanding in a centrifugal and symmetrical pattern on the crown. Triggering or aggravation of the disease may occur following traumatic hair care practices, such as cornrows and braiding, extensions, weaves with sewn-in or glued-on hair, use of hot combs, and frequent use of hair relaxers. These practices should be discontinued to slow progression of disease. Therapies may stop or slow progression but generally do not lead to hair regrowth in scarred areas.   Chronic and persistent condition with duration or expected duration over one year. Condition is bothersome/symptomatic for patient. Currently flared.   Biopsy proven,  Biopsy c/w CCCA, clinically suggestive of lichen planopilaris.   Discussed ILK injections, doxycycline, topical steroids to treat inflammation. Also discussed dutasteride, minoxidil.   Start doxycycline '100mg'$  bid with food x 1 week then decrease to '20mg'$  bid with food (she has tenderness at biopsy site so will treat with doxycycline 100 mg twice a day for a week in case of infection).  Start clobetasol 0.05% solution qd/bid prn itch. Avoid applying to face, groin, and axilla. Use as directed. Long-term use can cause thinning of the skin.  Topical steroids (  such as triamcinolone, fluocinolone, fluocinonide, mometasone, clobetasol, halobetasol, betamethasone, hydrocortisone) can cause thinning and lightening of the skin if they are used for too long in the same area. Your physician has selected the right strength medicine  for your problem and area affected on the body. Please use your medication only as directed by your physician to prevent side effects.   Doxycycline should be taken with food to prevent nausea. Do not lay down for 30 minutes after taking. Be cautious with sun exposure and use good sun protection while on this medication. Pregnant women should not take this medication.     doxycycline (MONODOX) 100 MG capsule - Scalp Take 1 capsule (100 mg total) by mouth 2 (two) times daily for 7 days. Take with food.   Encounter for Removal of Sutures - Incision site at the right vertex scalp is clean, dry and intact - Wound cleansed, sutures removed, wound cleansed and steri strips applied.  - Discussed pathology results showing scarring alopecia/CCCA type hair loss  - Patient advised to keep steri-strips dry until they fall off. - Scars remodel for a full year. - Once steri-strips fall off, patient can apply over-the-counter silicone scar cream each night to help with scar remodeling if desired. - Patient advised to call with any concerns or if they notice any new or changing lesions.  Return in about 3 weeks (around 03/15/2022) for ILK injections, Alopecia.  Graciella Belton, RMA, am acting as scribe for Forest Gleason, MD .   Documentation: I have reviewed the above documentation for accuracy and completeness, and I agree with the above.  Forest Gleason, MD

## 2022-02-24 ENCOUNTER — Other Ambulatory Visit: Payer: Self-pay

## 2022-02-28 ENCOUNTER — Other Ambulatory Visit: Payer: Self-pay

## 2022-03-01 ENCOUNTER — Other Ambulatory Visit: Payer: Self-pay

## 2022-03-03 ENCOUNTER — Other Ambulatory Visit: Payer: Self-pay

## 2022-03-06 ENCOUNTER — Other Ambulatory Visit: Payer: Self-pay

## 2022-03-13 ENCOUNTER — Ambulatory Visit (INDEPENDENT_AMBULATORY_CARE_PROVIDER_SITE_OTHER): Payer: 59 | Admitting: Dermatology

## 2022-03-13 ENCOUNTER — Encounter: Payer: Self-pay | Admitting: Dermatology

## 2022-03-13 VITALS — BP 132/74 | HR 76

## 2022-03-13 DIAGNOSIS — L669 Cicatricial alopecia, unspecified: Secondary | ICD-10-CM

## 2022-03-13 NOTE — Patient Instructions (Addendum)
Continue Clobetasol solution and Doxycycline as directed.   Doxycycline should be taken with food to prevent nausea. Do not lay down for 30 minutes after taking. Be cautious with sun exposure and use good sun protection while on this medication. Pregnant women should not take this medication.   Topical steroids (such as triamcinolone, fluocinolone, fluocinonide, mometasone, clobetasol, halobetasol, betamethasone, hydrocortisone) can cause thinning and lightening of the skin if they are used for too long in the same area. Your physician has selected the right strength medicine for your problem and area affected on the body. Please use your medication only as directed by your physician to prevent side effects.     Due to recent changes in healthcare laws, you may see results of your pathology and/or laboratory studies on MyChart before the doctors have had a chance to review them. We understand that in some cases there may be results that are confusing or concerning to you. Please understand that not all results are received at the same time and often the doctors may need to interpret multiple results in order to provide you with the best plan of care or course of treatment. Therefore, we ask that you please give Olivia Sanchez 2 business days to thoroughly review all your results before contacting the office for clarification. Should we see a critical lab result, you will be contacted sooner.   If You Need Anything After Your Visit  If you have any questions or concerns for your doctor, please call our main line at [phone] and press option 4 to reach your doctor's medical assistant. If no one answers, please leave a voicemail as directed and we will return your call as soon as possible. Messages left after 4 pm will be answered the following business day.   You may also send Olivia Sanchez a message via [place]. We typically respond to MyChart messages within 1-2 business days.  For prescription refills, please ask  your pharmacy to contact our office. Our fax number is [phone].  If you have an urgent issue when the clinic is closed that cannot wait until the next business day, you can page your doctor at the number below.    Please note that while we do our best to be available for urgent issues outside of office hours, we are not available 24/7.   If you have an urgent issue and are unable to reach Olivia Sanchez, you may choose to seek medical care at your doctor's office, retail clinic, urgent care center, or emergency room.  If you have a medical emergency, please immediately call 911 or go to the emergency department.  Pager Numbers  - Dr. Nehemiah Massed: [phone]  - Dr. Laurence Ferrari: [phone]  - Dr. Nicole Kindred: [phone]  In the event of inclement weather, please call our main line at [phone] for an update on the status of any delays or closures.  Dermatology Medication Tips: Please keep the boxes that topical medications come in in order to help keep track of the instructions about where and how to use these. Pharmacies typically print the medication instructions only on the boxes and not directly on the medication tubes.   If your medication is too expensive, please contact our office at [phone] option 4 or send Olivia Sanchez a message through [place].   We are unable to tell what your co-pay for medications will be in advance as this is different depending on your insurance coverage. However, we may be able to find a substitute medication at lower cost or fill out  paperwork to get insurance to cover a needed medication.   If a prior authorization is required to get your medication covered by your insurance company, please allow Olivia Sanchez 1-2 business days to complete this process.  Drug prices often vary depending on where the prescription is filled and some pharmacies may offer cheaper prices.  The website www.goodrx.com contains coupons for medications through different pharmacies. The prices here do not  account for what the cost may be with help from insurance (it may be cheaper with your insurance), but the website can give you the price if you did not use any insurance.  - You can print the associated coupon and take it with your prescription to the pharmacy.  - You may also stop by our office during regular business hours and pick up a GoodRx coupon card.  - If you need your prescription sent electronically to a different pharmacy, notify our office through [facility] or by phone at [phone] option 4.     Si Usted Necesita Algo Despus de Su Visita  Tambin puede enviarnos un mensaje a travs de Pharmacist, community. Por lo general respondemos a los mensajes de MyChart en el transcurso de 1 a 2 das hbiles.  Para renovar recetas, por favor pida a su farmacia que se ponga en contacto con nuestra oficina. Harland Dingwall de fax es [place] [phone].  Si tiene un asunto urgente cuando la clnica est cerrada y que no puede esperar hasta el siguiente da hbil, puede llamar/localizar a su doctor(a) al nmero que aparece a continuacin.   Por favor, tenga en cuenta que aunque hacemos todo lo posible para estar disponibles para asuntos urgentes fuera del horario de [place], no estamos disponibles las 24 horas del da, los 7 das de la [place].   Si tiene un problema urgente y no puede comunicarse con nosotros, puede optar por buscar atencin mdica  en el consultorio de su doctor(a), en una clnica privada, en un centro de atencin urgente o en una sala de emergencias.  Si tiene Engineering geologist, por favor llame inmediatamente al 911 o vaya a la sala de emergencias.  Nmeros de bper  - Dr. Nehemiah Massed: [phone]  - Dra. Moye: [phone]  - Dra. Nicole Kindred: [phone]  En caso de inclemencias del [place], por favor llame a Johnsie Kindred principal al [phone] para una actualizacin sobre el [place] de cualquier retraso o cierre.  Consejos para la medicacin en dermatologa: Por  favor, guarde las cajas en las que vienen los medicamentos de uso tpico para ayudarle a seguir las instrucciones sobre dnde y cmo usarlos. Las farmacias generalmente imprimen las instrucciones del medicamento slo en las cajas y no directamente en los tubos del [place].   Si su medicamento es muy caro, por favor, pngase en contacto con Zigmund Daniel llamando al [phone] y presione la opcin 4 o envenos un mensaje a travs de Pharmacist, community.   No podemos decirle cul ser su copago por los medicamentos por adelantado ya que esto es diferente dependiendo de la cobertura de su seguro. Sin embargo, es posible que podamos encontrar un medicamento sustituto a Electrical engineer un formulario para que el seguro cubra el medicamento que se considera necesario.   Si se requiere una autorizacin previa para que su compaa de seguros [place] su medicamento, por favor permtanos de 1 a 2 das hbiles para completar este proceso.  Los precios de los medicamentos varan con frecuencia dependiendo del Environmental consultant de dnde se surte la receta y [place]  farmacias pueden ofrecer precios ms baratos.  El sitio web www.goodrx.com tiene cupones para medicamentos de Airline pilot. Los precios aqu no tienen en cuenta lo que podra costar con la ayuda del seguro (puede ser ms barato con su seguro), pero el sitio web puede darle el precio si no utiliz Research scientist (physical sciences).  - Puede imprimir el cupn correspondiente y llevarlo con su receta a la farmacia.  - Tambin puede pasar por nuestra oficina durante el horario de atencin regular y Charity fundraiser una tarjeta de cupones de GoodRx.  - Si necesita que su receta se enve electrnicamente a una farmacia diferente, informe a nuestra oficina a travs de MyChart de [place] o por telfono llamando al [phone] y presione la opcin 4.

## 2022-03-13 NOTE — Progress Notes (Signed)
   Follow-Up Visit   Subjective  Olivia Sanchez is a 56 y.o. female who presents for the following: Alopecia (Bx proven CCCA type hair loss. Here for ILK injections. Using Clobetasol solution and taking Doxycycline '20mg'$  as directed).  The following portions of the chart were reviewed this encounter and updated as appropriate:  Tobacco  Allergies  Meds  Problems  Med Hx  Surg Hx  Fam Hx      Review of Systems: No other skin or systemic complaints except as noted in HPI or Assessment and Plan.   Objective  Well appearing patient in no apparent distress; mood and affect are within normal limits.  A focused examination was performed including scalp. Relevant physical exam findings are noted in the Assessment and Plan.  Scalp Areas of hair loss with loss of follicular ostia.    Assessment & Plan  Cicatricial alopecia Scalp  Not addressed today other than injections  Consider increasing to Kenalog 2.5 mg/kg if no resolution at follow up.  Continue Clobetasol solution and Doxycycline as directed.   Doxycycline should be taken with food to prevent nausea. Do not lay down for 30 minutes after taking. Be cautious with sun exposure and use good sun protection while on this medication. Pregnant women should not take this medication.   Topical steroids (such as triamcinolone, fluocinolone, fluocinonide, mometasone, clobetasol, halobetasol, betamethasone, hydrocortisone) can cause thinning and lightening of the skin if they are used for too long in the same area. Your physician has selected the right strength medicine for your problem and area affected on the body. Please use your medication only as directed by your physician to prevent side effects.    Intralesional injection - Scalp Location: scalp  Informed Consent: Discussed risks (infection, pain, bleeding, bruising, thinning of the skin, loss of skin pigment, lack of resolution, and recurrence of lesion) and benefits of the  procedure, as well as the alternatives. Informed consent was obtained. Preparation: The area was prepared a standard fashion.  Anesthesia: none  Procedure Details: An intralesional injection was performed with Kenalog 2 mg/cc. 3 cc in total were injected.  Total number of injections: 15+  Plan: The patient was instructed on post-op care. Recommend OTC analgesia as needed for pain.   NDC: 4098-1191-47   Return for Alopecia Follow Up in 3-6 weeks.  I, Emelia Salisbury, CMA, am acting as scribe for Forest Gleason, MD.  Documentation: I have reviewed the above documentation for accuracy and completeness, and I agree with the above.  Forest Gleason, MD

## 2022-03-14 ENCOUNTER — Other Ambulatory Visit: Payer: Self-pay

## 2022-03-17 ENCOUNTER — Other Ambulatory Visit: Payer: Self-pay

## 2022-03-17 MED ORDER — METHYLPHENIDATE HCL 20 MG PO TABS
ORAL_TABLET | ORAL | 0 refills | Status: DC
  Filled 2022-03-17: qty 170, 85d supply, fill #0
  Filled 2022-03-17: qty 10, 5d supply, fill #0

## 2022-03-17 MED ORDER — QUETIAPINE FUMARATE 25 MG PO TABS
ORAL_TABLET | ORAL | 2 refills | Status: DC
  Filled 2022-03-17: qty 30, fill #0
  Filled 2022-03-24: qty 30, 30d supply, fill #0

## 2022-03-24 ENCOUNTER — Ambulatory Visit
Admission: RE | Admit: 2022-03-24 | Discharge: 2022-03-24 | Disposition: A | Discharge status: Home / Self Care | Payer: 59 | Source: Ambulatory Visit | Attending: Internal Medicine | Admitting: Internal Medicine

## 2022-03-24 ENCOUNTER — Other Ambulatory Visit: Payer: Self-pay

## 2022-03-24 DIAGNOSIS — Z1231 Encounter for screening mammogram for malignant neoplasm of breast: Secondary | ICD-10-CM | POA: Diagnosis not present

## 2022-04-06 ENCOUNTER — Other Ambulatory Visit: Payer: Self-pay

## 2022-04-06 MED ORDER — ALPRAZOLAM 0.25 MG PO TABS
0.2500 mg | ORAL_TABLET | Freq: Four times a day (QID) | ORAL | 5 refills | Status: DC | PRN
  Filled 2022-04-06: qty 60, 15d supply, fill #0
  Filled 2022-04-26: qty 60, 15d supply, fill #1
  Filled 2022-05-17: qty 60, 15d supply, fill #2
  Filled 2022-06-14: qty 60, 15d supply, fill #3
  Filled 2022-07-27: qty 60, 15d supply, fill #4
  Filled 2022-08-31: qty 60, 15d supply, fill #5

## 2022-04-06 MED ORDER — QUETIAPINE FUMARATE 50 MG PO TABS
50.0000 mg | ORAL_TABLET | Freq: Every day | ORAL | 5 refills | Status: DC
  Filled 2022-04-06: qty 30, 30d supply, fill #0
  Filled 2022-05-01: qty 30, 30d supply, fill #1
  Filled 2022-05-29: qty 30, 30d supply, fill #2
  Filled 2022-06-26: qty 30, 30d supply, fill #3

## 2022-04-06 MED ORDER — PROPRANOLOL HCL ER 80 MG PO CP24
80.0000 mg | ORAL_CAPSULE | Freq: Every day | ORAL | 11 refills | Status: DC
  Filled 2022-04-06: qty 30, 30d supply, fill #0
  Filled 2022-05-24: qty 30, 30d supply, fill #1
  Filled 2022-06-27: qty 30, 30d supply, fill #2
  Filled 2022-08-04: qty 30, 30d supply, fill #3
  Filled 2022-09-17: qty 30, 30d supply, fill #4
  Filled 2022-10-25: qty 30, 30d supply, fill #5
  Filled 2022-11-27: qty 30, 30d supply, fill #6
  Filled 2023-01-11: qty 30, 30d supply, fill #7

## 2022-04-11 ENCOUNTER — Ambulatory Visit: Payer: Commercial Managed Care - PPO | Admitting: Dermatology

## 2022-04-26 ENCOUNTER — Other Ambulatory Visit: Payer: Self-pay

## 2022-04-26 MED ORDER — ROSUVASTATIN CALCIUM 10 MG PO TABS
10.0000 mg | ORAL_TABLET | Freq: Every day | ORAL | 4 refills | Status: DC
  Filled 2022-04-26: qty 90, 90d supply, fill #0
  Filled 2022-08-04: qty 90, 90d supply, fill #1
  Filled 2022-11-27: qty 90, 90d supply, fill #2
  Filled 2023-04-11: qty 90, 90d supply, fill #3

## 2022-04-27 ENCOUNTER — Other Ambulatory Visit: Payer: Self-pay

## 2022-04-28 ENCOUNTER — Other Ambulatory Visit: Payer: Self-pay

## 2022-05-02 ENCOUNTER — Other Ambulatory Visit: Payer: Self-pay

## 2022-05-09 ENCOUNTER — Ambulatory Visit: Payer: Commercial Managed Care - PPO | Admitting: Dermatology

## 2022-05-16 ENCOUNTER — Other Ambulatory Visit: Payer: Self-pay

## 2022-05-16 MED ORDER — LATANOPROST 0.005 % OP SOLN
1.0000 [drp] | Freq: Every evening | OPHTHALMIC | 3 refills | Status: DC
  Filled 2022-05-16: qty 2.5, 25d supply, fill #0
  Filled 2022-08-31: qty 2.5, 25d supply, fill #1
  Filled 2022-10-25: qty 2.5, 25d supply, fill #2
  Filled 2022-12-15: qty 2.5, 25d supply, fill #3

## 2022-05-17 ENCOUNTER — Other Ambulatory Visit: Payer: Self-pay

## 2022-05-18 ENCOUNTER — Other Ambulatory Visit: Payer: Self-pay

## 2022-05-25 ENCOUNTER — Other Ambulatory Visit: Payer: Self-pay

## 2022-05-29 ENCOUNTER — Other Ambulatory Visit: Payer: Self-pay

## 2022-05-31 ENCOUNTER — Other Ambulatory Visit: Payer: Self-pay

## 2022-05-31 ENCOUNTER — Other Ambulatory Visit: Payer: Self-pay | Admitting: Obstetrics & Gynecology

## 2022-05-31 DIAGNOSIS — Z3041 Encounter for surveillance of contraceptive pills: Secondary | ICD-10-CM

## 2022-06-01 ENCOUNTER — Other Ambulatory Visit: Payer: Self-pay

## 2022-06-02 ENCOUNTER — Other Ambulatory Visit: Payer: Self-pay

## 2022-06-06 ENCOUNTER — Other Ambulatory Visit: Payer: Self-pay | Admitting: Obstetrics and Gynecology

## 2022-06-06 ENCOUNTER — Other Ambulatory Visit: Payer: Self-pay

## 2022-06-06 DIAGNOSIS — Z3041 Encounter for surveillance of contraceptive pills: Secondary | ICD-10-CM

## 2022-06-06 MED FILL — Norethindrone Tab 0.35 MG: ORAL | 28 days supply | Qty: 28 | Fill #0 | Status: AC

## 2022-06-08 ENCOUNTER — Other Ambulatory Visit: Payer: Self-pay

## 2022-06-09 ENCOUNTER — Other Ambulatory Visit: Payer: Self-pay

## 2022-06-14 ENCOUNTER — Other Ambulatory Visit: Payer: Self-pay

## 2022-06-15 ENCOUNTER — Other Ambulatory Visit: Payer: Self-pay

## 2022-06-15 MED ORDER — NURTEC 75 MG PO TBDP
75.0000 mg | ORAL_TABLET | ORAL | 5 refills | Status: DC
  Filled 2022-07-07: qty 16, 30d supply, fill #0
  Filled 2022-07-18: qty 16, 32d supply, fill #0

## 2022-06-26 ENCOUNTER — Other Ambulatory Visit: Payer: Self-pay

## 2022-06-27 ENCOUNTER — Other Ambulatory Visit: Payer: Self-pay

## 2022-06-28 ENCOUNTER — Other Ambulatory Visit: Payer: Self-pay

## 2022-07-03 NOTE — Progress Notes (Unsigned)
PCP:  Marguarite Arbour, MD   No chief complaint on file.    HPI:      Olivia Sanchez is a 57 y.o. G0P0000 who LMP was No LMP recorded. (Menstrual status: Irregular Periods)., presents today for her annual examination.  Her menses are irregular now with POPs. Bleeding is light for 4-7 days. Dysmenorrhea mild. She does not have intermenstrual bleeding. Pt on camila for cycle control with leio.  Sex activity: not sexually active. Was earlier this yr. Last Pap: 06/16/21 Results were: no abnormalities  04/06/20 Results were normal 09/30/19 Results were normal 04/02/19 Results were normal 09/30/19 Results were normal 03/29/18 colpo bx with CIN 1  03/13/18 ASC-H/neg HPV DNA Hx of STDs: HSV on labs only, never any sx. Neg STD testing otherwise last yr.  Last mammogram: 03/24/22  Results were: normal--routine follow-up in 12 months There is no FH of breast cancer. There is no FH of ovarian cancer. The patient does do self-breast exams.  Tobacco use: The patient denies current or previous tobacco use. Alcohol use: social drinker No drug use.  Exercise: not active  Colonoscopy: 2015 without abn findings. Repeat after 10 yrs per pt report.  She does get adequate calcium but not Vitamin D in her diet.  Labs with PCP. Seeing psych for depression/medications.    Past Medical History:  Diagnosis Date   Arthritis    Depression    Esophageal reflux    Family history of adverse reaction to anesthesia    maternal aunt and maternal uncle and aunts daughter have malignant hyperthermia   Fibroids    Glaucoma    Herpes genitalia    Hypercholesteremia    Hypertension    Malignant hyperthermia    HUGE FAMILY HISTORY OF MH-PTS MATERNAL AUNT AND UNCLE AND COUSIN-PT HAS NEVER HAD ANY ISSUES BUT SHE HAS ONLY HAD COLONOSCOPY AND EGD   Thyroid disease     Past Surgical History:  Procedure Laterality Date   COLONOSCOPY  02/2014   Dr.Elliott   CYSTOSCOPY     EAR CYST EXCISION Left 04/21/2019    Procedure: EXCISION OF CYST FROM LEFT KNEE;  Surgeon: Donato Heinz, MD;  Location: [facility] ORS;  Service: Orthopedics;  Laterality: Left;   ESOPHAGOGASTRODUODENOSCOPY      Family History  Problem Relation Age of Onset   Dementia Mother        senile, early stages   Hypertension Mother    Breast cancer Maternal Aunt 38    Social History   Socioeconomic History   Marital status: Single    Spouse name: Not on file   Number of children: Not on file   Years of education: Not on file   Highest education level: Not on file  Occupational History   Not on file  Tobacco Use   Smoking status: Never   Smokeless tobacco: Never  Vaping Use   Vaping Use: Never used  Substance and Sexual Activity   Alcohol use: Not Currently   Drug use: No   Sexual activity: Not Currently    Partners: Male    Birth control/protection: None  Other Topics Concern   Not on file  Social History Narrative   Not on file   Social Determinants of Health   Financial Resource Strain: Not on file  Food Insecurity: Not on file  Transportation Needs: Not on file  Physical Activity: Not on file  Stress: Not on file  Social Connections: Not on file  Intimate Partner  Violence: Not on file    No outpatient medications have been marked as taking for the 07/04/22 encounter (Appointment) with Naomi Fitton, Ilona Sorrel, PA-C.     ROS:  Review of Systems  Constitutional:  Positive for fatigue. Negative for fever and unexpected weight change.  Respiratory:  Negative for cough, shortness of breath and wheezing.   Cardiovascular:  Negative for chest pain, palpitations and leg swelling.  Gastrointestinal:  Negative for blood in stool, constipation, diarrhea, nausea and vomiting.  Endocrine: Negative for cold intolerance, heat intolerance and polyuria.  Genitourinary:  Negative for dyspareunia, dysuria, flank pain, frequency, genital sores, hematuria, menstrual problem, pelvic pain, urgency, vaginal bleeding, vaginal  discharge and vaginal pain.  Musculoskeletal:  Negative for back pain, joint swelling and myalgias.  Skin:  Negative for rash.  Neurological:  Negative for dizziness, syncope, light-headedness, numbness and headaches.  Hematological:  Negative for adenopathy.  Psychiatric/Behavioral:  Positive for agitation and dysphoric mood. Negative for confusion, sleep disturbance and suicidal ideas. The patient is not nervous/anxious.      Objective: There were no vitals taken for this visit.   Physical Exam Constitutional:      Appearance: She is well-developed.  Genitourinary:     Vaginal bleeding present.     No vaginal discharge, erythema or tenderness.      Right Adnexa: not tender and no mass present.    Left Adnexa: not tender and no mass present.    No cervical motion tenderness or polyp.     Uterus is not enlarged or tender.  Breasts:    Right: No mass, nipple discharge, skin change or tenderness.     Left: No mass, nipple discharge, skin change or tenderness.  Neck:     Thyroid: No thyromegaly.  Cardiovascular:     Rate and Rhythm: Normal rate and regular rhythm.     Heart sounds: Normal heart sounds. No murmur heard. Pulmonary:     Effort: Pulmonary effort is normal.     Breath sounds: Normal breath sounds.  Abdominal:     Palpations: Abdomen is soft.     Tenderness: There is no abdominal tenderness. There is no guarding.  Musculoskeletal:        General: Normal range of motion.     Cervical back: Normal range of motion.  Neurological:     Mental Status: She is alert and oriented to person, place, and time.     Cranial Nerves: No cranial nerve deficit.  Psychiatric:        Behavior: Behavior normal.  Vitals reviewed.     Assessment/Plan: No diagnosis found.           GYN counsel breast self exam, mammography screening, adequate intake of calcium and vitamin D, diet and exercise     F/U  No follow-ups on file.  Morse Brueggemann B. Aliyana Dlugosz, PA-C 07/03/2022 8:44 PM

## 2022-07-04 ENCOUNTER — Other Ambulatory Visit: Payer: Self-pay

## 2022-07-04 ENCOUNTER — Other Ambulatory Visit (HOSPITAL_COMMUNITY)
Admission: RE | Admit: 2022-07-04 | Discharge: 2022-07-04 | Disposition: A | Discharge status: Home / Self Care | Payer: 59 | Source: Ambulatory Visit | Attending: Obstetrics and Gynecology | Admitting: Obstetrics and Gynecology

## 2022-07-04 ENCOUNTER — Encounter: Payer: Self-pay | Admitting: Obstetrics and Gynecology

## 2022-07-04 ENCOUNTER — Ambulatory Visit (INDEPENDENT_AMBULATORY_CARE_PROVIDER_SITE_OTHER): Payer: 59 | Admitting: Obstetrics and Gynecology

## 2022-07-04 VITALS — BP 136/80 | Ht 60.0 in | Wt 180.0 lb

## 2022-07-04 DIAGNOSIS — Z3041 Encounter for surveillance of contraceptive pills: Secondary | ICD-10-CM

## 2022-07-04 DIAGNOSIS — N870 Mild cervical dysplasia: Secondary | ICD-10-CM | POA: Insufficient documentation

## 2022-07-04 DIAGNOSIS — Z1151 Encounter for screening for human papillomavirus (HPV): Secondary | ICD-10-CM

## 2022-07-04 DIAGNOSIS — Z124 Encounter for screening for malignant neoplasm of cervix: Secondary | ICD-10-CM

## 2022-07-04 DIAGNOSIS — Z1231 Encounter for screening mammogram for malignant neoplasm of breast: Secondary | ICD-10-CM

## 2022-07-04 DIAGNOSIS — Z01419 Encounter for gynecological examination (general) (routine) without abnormal findings: Secondary | ICD-10-CM

## 2022-07-04 DIAGNOSIS — D219 Benign neoplasm of connective and other soft tissue, unspecified: Secondary | ICD-10-CM

## 2022-07-04 DIAGNOSIS — Z113 Encounter for screening for infections with a predominantly sexual mode of transmission: Secondary | ICD-10-CM | POA: Diagnosis not present

## 2022-07-04 MED ORDER — METHYLPHENIDATE HCL 20 MG PO TABS
20.0000 mg | ORAL_TABLET | Freq: Two times a day (BID) | ORAL | 0 refills | Status: DC
  Filled 2022-07-04: qty 180, 90d supply, fill #0

## 2022-07-04 NOTE — Patient Instructions (Signed)
I value your feedback and you entrusting us with your care. If you get a [facility] patient survey, I would appreciate you taking the time to let us know about your experience today. Thank you!  [facility]: [phone]      

## 2022-07-05 LAB — HEPATITIS C ANTIBODY: Hep C Virus Ab: NONREACTIVE

## 2022-07-05 LAB — RPR QUALITATIVE: RPR Ser Ql: NONREACTIVE

## 2022-07-05 LAB — HIV ANTIBODY (ROUTINE TESTING W REFLEX): HIV Screen 4th Generation wRfx: NONREACTIVE

## 2022-07-06 LAB — CYTOLOGY - PAP
Adequacy: ABSENT
Chlamydia: NEGATIVE
Comment: NEGATIVE
Comment: NEGATIVE
Comment: NEGATIVE
Comment: NORMAL
Diagnosis: NEGATIVE
High risk HPV: NEGATIVE
Neisseria Gonorrhea: NEGATIVE
Trichomonas: NEGATIVE

## 2022-07-07 ENCOUNTER — Other Ambulatory Visit: Payer: Self-pay

## 2022-07-07 ENCOUNTER — Other Ambulatory Visit (HOSPITAL_COMMUNITY): Payer: Self-pay

## 2022-07-13 ENCOUNTER — Other Ambulatory Visit: Payer: Self-pay

## 2022-07-13 MED ORDER — METHOCARBAMOL 500 MG PO TABS
500.0000 mg | ORAL_TABLET | Freq: Two times a day (BID) | ORAL | 3 refills | Status: DC
  Filled 2022-07-13: qty 180, 90d supply, fill #0
  Filled 2022-10-30: qty 180, 90d supply, fill #1
  Filled 2023-01-24: qty 180, 90d supply, fill #2
  Filled 2023-05-29: qty 180, 90d supply, fill #3

## 2022-07-13 MED ORDER — BUPROPION HCL ER (XL) 300 MG PO TB24
300.0000 mg | ORAL_TABLET | Freq: Every day | ORAL | 2 refills | Status: DC
  Filled 2022-07-13: qty 90, 90d supply, fill #0
  Filled 2022-10-25: qty 90, 90d supply, fill #1
  Filled 2023-01-11: qty 90, 90d supply, fill #2

## 2022-07-14 ENCOUNTER — Other Ambulatory Visit: Payer: Self-pay

## 2022-07-18 ENCOUNTER — Other Ambulatory Visit: Payer: Self-pay

## 2022-07-19 ENCOUNTER — Other Ambulatory Visit: Payer: Self-pay

## 2022-07-19 MED ORDER — NURTEC 75 MG PO TBDP
75.0000 mg | ORAL_TABLET | ORAL | 6 refills | Status: DC
  Filled 2022-07-19 – 2023-07-06 (×2): qty 16, 32d supply, fill #0

## 2022-07-19 MED ORDER — SUMATRIPTAN SUCCINATE 100 MG PO TABS
100.0000 mg | ORAL_TABLET | ORAL | 3 refills | Status: DC
  Filled 2022-07-19: qty 9, 15d supply, fill #0
  Filled 2022-08-23: qty 9, 15d supply, fill #1

## 2022-07-27 ENCOUNTER — Other Ambulatory Visit: Payer: Self-pay

## 2022-07-27 MED ORDER — QUETIAPINE FUMARATE 50 MG PO TABS
50.0000 mg | ORAL_TABLET | Freq: Every day | ORAL | 5 refills | Status: DC
  Filled 2022-07-27: qty 30, 30d supply, fill #0
  Filled 2022-08-31: qty 30, 30d supply, fill #1
  Filled 2022-10-02: qty 30, 30d supply, fill #2
  Filled 2022-10-30: qty 30, 30d supply, fill #3
  Filled 2022-11-27: qty 30, 30d supply, fill #4
  Filled 2022-12-27: qty 30, 30d supply, fill #5

## 2022-08-04 ENCOUNTER — Other Ambulatory Visit: Payer: Self-pay

## 2022-08-04 MED ORDER — OMEPRAZOLE 20 MG PO CPDR
20.0000 mg | DELAYED_RELEASE_CAPSULE | Freq: Two times a day (BID) | ORAL | 1 refills | Status: DC
  Filled 2022-08-04: qty 180, 90d supply, fill #0
  Filled 2022-11-18: qty 180, 90d supply, fill #1

## 2022-08-31 ENCOUNTER — Other Ambulatory Visit: Payer: Self-pay

## 2022-09-21 ENCOUNTER — Other Ambulatory Visit: Payer: Self-pay

## 2022-09-22 ENCOUNTER — Other Ambulatory Visit: Payer: Self-pay

## 2022-09-22 MED ORDER — VALSARTAN 160 MG PO TABS
160.0000 mg | ORAL_TABLET | Freq: Every day | ORAL | 3 refills | Status: DC
  Filled 2022-09-22: qty 90, 90d supply, fill #0
  Filled 2023-01-11: qty 90, 90d supply, fill #1
  Filled 2023-04-11: qty 90, 90d supply, fill #2
  Filled 2023-08-08: qty 90, 90d supply, fill #3

## 2022-09-26 ENCOUNTER — Other Ambulatory Visit: Payer: Self-pay

## 2022-09-26 MED ORDER — ETODOLAC 400 MG PO TABS
400.0000 mg | ORAL_TABLET | Freq: Two times a day (BID) | ORAL | 3 refills | Status: DC
  Filled 2022-09-26: qty 180, 90d supply, fill #0
  Filled 2023-01-11: qty 180, 90d supply, fill #1
  Filled 2023-05-29: qty 180, 90d supply, fill #2

## 2022-09-27 ENCOUNTER — Other Ambulatory Visit: Payer: Self-pay

## 2022-09-28 ENCOUNTER — Other Ambulatory Visit: Payer: Self-pay

## 2022-09-29 ENCOUNTER — Other Ambulatory Visit: Payer: Self-pay

## 2022-09-29 MED ORDER — ALPRAZOLAM 0.25 MG PO TABS
0.2500 mg | ORAL_TABLET | Freq: Four times a day (QID) | ORAL | 5 refills | Status: DC | PRN
  Filled 2022-09-29: qty 60, 15d supply, fill #0
  Filled 2022-10-30: qty 60, 15d supply, fill #1
  Filled 2022-11-27: qty 60, 15d supply, fill #2
  Filled 2022-12-27: qty 60, 15d supply, fill #3
  Filled 2023-01-24: qty 60, 15d supply, fill #4
  Filled 2023-03-19: qty 60, 15d supply, fill #5

## 2022-10-02 ENCOUNTER — Other Ambulatory Visit: Payer: Self-pay

## 2022-10-09 ENCOUNTER — Other Ambulatory Visit: Payer: Self-pay

## 2022-10-10 ENCOUNTER — Other Ambulatory Visit: Payer: Self-pay

## 2022-10-10 MED ORDER — TOPIRAMATE 100 MG PO TABS
100.0000 mg | ORAL_TABLET | Freq: Every day | ORAL | 3 refills | Status: DC
  Filled 2022-10-10: qty 90, 90d supply, fill #0
  Filled 2023-01-11: qty 90, 90d supply, fill #1

## 2022-10-10 MED ORDER — TOPIRAMATE 100 MG PO TABS: 100.0000 mg | ORAL_TABLET | Freq: Every day | ORAL | 3 refills | Status: DC

## 2022-10-11 ENCOUNTER — Other Ambulatory Visit: Payer: Self-pay

## 2022-10-11 MED ORDER — METHYLPHENIDATE HCL 20 MG PO TABS
20.0000 mg | ORAL_TABLET | Freq: Two times a day (BID) | ORAL | 0 refills | Status: DC
  Filled 2022-10-11: qty 180, 90d supply, fill #0

## 2022-10-11 MED ORDER — TOPIRAMATE 50 MG PO TABS
50.0000 mg | ORAL_TABLET | Freq: Every day | ORAL | 3 refills | Status: DC
  Filled 2022-10-11 (×2): qty 90, 90d supply, fill #0
  Filled 2023-01-11: qty 90, 90d supply, fill #1
  Filled 2023-05-29: qty 90, 90d supply, fill #2
  Filled 2023-10-02: qty 90, 90d supply, fill #3

## 2022-10-12 ENCOUNTER — Other Ambulatory Visit: Payer: Self-pay

## 2022-10-13 ENCOUNTER — Other Ambulatory Visit: Payer: Self-pay

## 2022-10-17 ENCOUNTER — Other Ambulatory Visit: Payer: Self-pay

## 2022-10-17 MED ORDER — SUMATRIPTAN SUCCINATE 100 MG PO TABS
ORAL_TABLET | ORAL | 3 refills | Status: DC
  Filled 2022-10-17: qty 9, 30d supply, fill #0
  Filled 2022-12-29: qty 9, 30d supply, fill #1
  Filled 2023-01-24: qty 9, 30d supply, fill #2
  Filled 2023-06-19: qty 9, 30d supply, fill #3

## 2022-10-25 ENCOUNTER — Other Ambulatory Visit: Payer: Self-pay

## 2022-10-26 ENCOUNTER — Other Ambulatory Visit: Payer: Self-pay

## 2022-10-30 ENCOUNTER — Other Ambulatory Visit: Payer: Self-pay

## 2022-10-31 ENCOUNTER — Other Ambulatory Visit: Payer: Self-pay

## 2022-11-01 ENCOUNTER — Ambulatory Visit: Payer: Self-pay | Admitting: Dermatology

## 2022-11-19 ENCOUNTER — Other Ambulatory Visit: Payer: Self-pay

## 2022-11-23 ENCOUNTER — Other Ambulatory Visit: Payer: Self-pay

## 2022-11-23 ENCOUNTER — Ambulatory Visit: Payer: Self-pay | Admitting: Dermatology

## 2022-11-24 ENCOUNTER — Other Ambulatory Visit: Payer: Self-pay

## 2022-11-24 MED ORDER — PHENAZOPYRIDINE HCL 200 MG PO TABS
200.0000 mg | ORAL_TABLET | Freq: Three times a day (TID) | ORAL | 0 refills | Status: DC
  Filled 2022-11-24: qty 6, 2d supply, fill #0

## 2022-11-24 MED ORDER — CEFDINIR 300 MG PO CAPS
ORAL_CAPSULE | ORAL | 0 refills | Status: DC
  Filled 2022-11-24: qty 14, 7d supply, fill #0

## 2022-11-24 MED ORDER — FLUCONAZOLE 150 MG PO TABS
ORAL_TABLET | ORAL | 0 refills | Status: DC
  Filled 2022-11-24: qty 3, 9d supply, fill #0

## 2022-11-25 ENCOUNTER — Emergency Department
Admission: EM | Admit: 2022-11-25 | Discharge: 2022-11-25 | Disposition: A | Discharge status: Home / Self Care | Payer: Self-pay | Attending: Emergency Medicine | Admitting: Emergency Medicine

## 2022-11-25 ENCOUNTER — Emergency Department: Payer: Self-pay

## 2022-11-25 ENCOUNTER — Other Ambulatory Visit: Payer: Self-pay

## 2022-11-25 DIAGNOSIS — N3001 Acute cystitis with hematuria: Secondary | ICD-10-CM | POA: Insufficient documentation

## 2022-11-25 DIAGNOSIS — R110 Nausea: Secondary | ICD-10-CM | POA: Insufficient documentation

## 2022-11-25 DIAGNOSIS — R197 Diarrhea, unspecified: Secondary | ICD-10-CM | POA: Insufficient documentation

## 2022-11-25 DIAGNOSIS — I10 Essential (primary) hypertension: Secondary | ICD-10-CM | POA: Insufficient documentation

## 2022-11-25 LAB — COMPREHENSIVE METABOLIC PANEL
ALT: 17 U/L (ref 0–44)
AST: 18 U/L (ref 15–41)
Albumin: 3.5 g/dL (ref 3.5–5.0)
Alkaline Phosphatase: 66 U/L (ref 38–126)
Anion gap: 9 (ref 5–15)
BUN: 15 mg/dL (ref 6–20)
CO2: 22 mmol/L (ref 22–32)
Calcium: 9.2 mg/dL (ref 8.9–10.3)
Chloride: 111 mmol/L (ref 98–111)
Creatinine, Ser: 1.01 mg/dL — ABNORMAL HIGH (ref 0.44–1.00)
GFR, Estimated: >60 mL/min (ref >60)
Glucose, Bld: 89 mg/dL (ref 70–99)
Potassium: 3.9 mmol/L (ref 3.5–5.1)
Sodium: 142 mmol/L (ref 135–145)
Total Bilirubin: 0.6 mg/dL (ref 0.3–1.2)
Total Protein: 7.2 g/dL (ref 6.5–8.1)

## 2022-11-25 LAB — CBC WITH DIFFERENTIAL/PLATELET
Abs Immature Granulocytes: 0.02 10*3/uL (ref 0.00–0.07)
Basophils Absolute: 0.1 10*3/uL (ref 0.0–0.1)
Basophils Relative: 1 %
Eosinophils Absolute: 0.4 10*3/uL (ref 0.0–0.5)
Eosinophils Relative: 7 %
HCT: 33.6 % — ABNORMAL LOW (ref 36.0–46.0)
Hemoglobin: 10.6 g/dL — ABNORMAL LOW (ref 12.0–15.0)
Immature Granulocytes: 0 %
Lymphocytes Relative: 37 %
Lymphs Abs: 2.3 10*3/uL (ref 0.7–4.0)
MCH: 27.1 pg (ref 26.0–34.0)
MCHC: 31.5 g/dL (ref 30.0–36.0)
MCV: 85.9 fL (ref 80.0–100.0)
Monocytes Absolute: 0.5 10*3/uL (ref 0.1–1.0)
Monocytes Relative: 9 %
Neutro Abs: 2.8 10*3/uL (ref 1.7–7.7)
Neutrophils Relative %: 46 %
Platelets: 379 10*3/uL (ref 150–400)
RBC: 3.91 MIL/uL (ref 3.87–5.11)
RDW: 13.9 % (ref 11.5–15.5)
WBC: 6.1 10*3/uL (ref 4.0–10.5)
nRBC: 0 % (ref 0.0–0.2)

## 2022-11-25 LAB — URINALYSIS, ROUTINE W REFLEX MICROSCOPIC
RBC / HPF: >50 RBC/hpf (ref 0–5)
WBC, UA: >50 WBC/hpf (ref 0–5)

## 2022-11-25 LAB — LIPASE, BLOOD: Lipase: 31 U/L (ref 11–51)

## 2022-11-25 MED ORDER — KETOROLAC TROMETHAMINE 15 MG/ML IJ SOLN
15.0000 mg | Freq: Once | INTRAMUSCULAR | Status: AC
  Administered 2022-11-25: 15 mg via INTRAVENOUS
  Filled 2022-11-25: qty 1

## 2022-11-25 MED ORDER — SODIUM CHLORIDE 0.9 % IV SOLN
2.0000 g | Freq: Once | INTRAVENOUS | Status: AC
  Administered 2022-11-25: 2 g via INTRAVENOUS
  Filled 2022-11-25: qty 20

## 2022-11-25 MED ORDER — SODIUM CHLORIDE 0.9 % IV BOLUS
1000.0000 mL | Freq: Once | INTRAVENOUS | Status: AC
  Administered 2022-11-25: 1000 mL via INTRAVENOUS

## 2022-11-25 MED ORDER — IOHEXOL 300 MG/ML  SOLN
100.0000 mL | Freq: Once | INTRAMUSCULAR | Status: AC | PRN
  Administered 2022-11-25: 100 mL via INTRAVENOUS

## 2022-11-25 NOTE — ED Triage Notes (Signed)
Pt on antibiotics for UTI at [facility] last Saturday and states symptoms have not improved. Pt c/o pelvic pain and pain w/ urination.

## 2022-11-25 NOTE — Discharge Instructions (Signed)
Continue cefdinir as directed.

## 2022-11-25 NOTE — ED Provider Notes (Signed)
[facility] Provider Note  Patient Contact: 5:47 PM (approximate)   History   UTI   HPI  Olivia Sanchez is a 57 y.o. female with a history of genital herpes, hypertension, depression and uterine fibroids, presents to the emergency department with concern for possible UTI-like symptoms that started last Saturday.  Patient reports that she presented to urgent care yesterday after having some dysuria and was prescribed cefdinir and Pyridium.  Patient has had 2 doses of medication but states that she started to experience some lower abdominal discomfort became concerned.  Patient localizes pain bilaterally to the right and left lower quadrants.  She has had some associated diarrhea that started before antibiotics and some mild nausea but no vomiting.  No significant flank pain.  No associated rhinorrhea, nasal congestion or nonproductive cough.      Physical Exam   Triage Vital Signs: ED Triage Vitals  Encounter Vitals Group     BP 11/25/22 1613 (!) 147/98     Systolic BP Percentile --      Diastolic BP Percentile --      Pulse Rate 11/25/22 1613 84     Resp 11/25/22 1613 18     Temp 11/25/22 1613 98.4 F (36.9 C)     Temp Source 11/25/22 1613 Oral     SpO2 11/25/22 1613 98 %     Weight --      Height --      Head Circumference --      Peak Flow --      Pain Score 11/25/22 1616 5     Pain Loc --      Pain Education --      Exclude from Growth Chart --     Most recent vital signs: Vitals:   11/25/22 1613 11/25/22 2115  BP: (!) 147/98 (!) 143/87  Pulse: 84 71  Resp: 18 20  Temp: 98.4 F (36.9 C) 97.9 F (36.6 C)  SpO2: 98% 100%     General: Alert and in no acute distress. Eyes:  PERRL. EOMI. Head: No acute traumatic findings ENT:      Nose: No congestion/rhinnorhea.      Mouth/Throat: Mucous membranes are moist. Neck: No stridor. No cervical spine tenderness to palpation. Cardiovascular:  Good peripheral perfusion Respiratory: Normal  respiratory effort without tachypnea or retractions. Lungs CTAB. Good air entry to the bases with no decreased or absent breath sounds. Gastrointestinal: Bowel sounds 4 quadrants. Soft and nontender to palpation. No guarding or rigidity. No palpable masses. No distention. No CVA tenderness. Musculoskeletal: Full range of motion to all extremities.  Neurologic:  No gross focal neurologic deficits are appreciated.  Skin:   No rash noted    ED Results / Procedures / Treatments   Labs (all labs ordered are listed, but only abnormal results are displayed) Labs Reviewed  URINALYSIS, ROUTINE W REFLEX MICROSCOPIC - Abnormal; Notable for the following components:      Result Value   Color, Urine   (*)    Value: TEST NOT REPORTED DUE TO COLOR INTERFERENCE OF URINE PIGMENT   APPearance   (*)    Value: TEST NOT REPORTED DUE TO COLOR INTERFERENCE OF URINE PIGMENT   Glucose, UA   (*)    Value: TEST NOT REPORTED DUE TO COLOR INTERFERENCE OF URINE PIGMENT   Hgb urine dipstick   (*)    Value: TEST NOT REPORTED DUE TO COLOR INTERFERENCE OF URINE PIGMENT   Bilirubin Urine   (*)  Value: TEST NOT REPORTED DUE TO COLOR INTERFERENCE OF URINE PIGMENT   Ketones, ur   (*)    Value: TEST NOT REPORTED DUE TO COLOR INTERFERENCE OF URINE PIGMENT   Protein, ur   (*)    Value: TEST NOT REPORTED DUE TO COLOR INTERFERENCE OF URINE PIGMENT   Nitrite   (*)    Value: TEST NOT REPORTED DUE TO COLOR INTERFERENCE OF URINE PIGMENT   Leukocytes,Ua   (*)    Value: TEST NOT REPORTED DUE TO COLOR INTERFERENCE OF URINE PIGMENT   Bacteria, UA RARE (*)    All other components within normal limits  CBC WITH DIFFERENTIAL/PLATELET - Abnormal; Notable for the following components:   Hemoglobin 10.6 (*)    HCT 33.6 (*)    All other components within normal limits  COMPREHENSIVE METABOLIC PANEL - Abnormal; Notable for the following components:   Creatinine, Ser 1.01 (*)    All other components within normal limits  LIPASE,  BLOOD       RADIOLOGY  I personally viewed and evaluated these images as part of my medical decision making, as well as reviewing the written report by the radiologist.  ED Provider Interpretation: CT abdomen pelvis findings suggestive of cystitis.   PROCEDURES:  Critical Care performed: No  Procedures   MEDICATIONS ORDERED IN ED: Medications  cefTRIAXone (ROCEPHIN) 2 g in sodium chloride 0.9 % 100 mL IVPB (2 g Intravenous New Bag/Given 11/25/22 2109)  ketorolac (TORADOL) 15 MG/ML injection 15 mg (has no administration in time range)  sodium chloride 0.9 % bolus 1,000 mL (0 mLs Intravenous Stopped 11/25/22 2114)  iohexol (OMNIPAQUE) 300 MG/ML solution 100 mL (100 mLs Intravenous Contrast Given 11/25/22 1934)     IMPRESSION / MDM / ASSESSMENT AND PLAN / ED COURSE  I reviewed the triage vital signs and the nursing notes.                              Assessment and plan Abdominal pain Cystitis 57 year old female presents to the emergency department with lower abdominal discomfort that started over the past 24 hours but UTI-like symptoms for the past week.  Patient's vital signs are reassuring at triage.  On exam, patient was alert, active and nontoxic-appearing.  Abdomen was soft and nontender without guarding.  CBC, CMP and lipase reassuring.  CT abdomen/pelvis findings suggestive of cystitis.  Urinalysis concerning for UTI with greater than 50 white blood cells and rare bacteria.  Patient was given 2 g of Rocephin while in the emergency department.  Recommended that patient continue taking her cefdinir as directed by provider yesterday.  Recommended return to the emergency department symptoms seem to be worsening at home.  Upon recheck, patient was resting comfortably and felt comfortable with this plan.     FINAL CLINICAL IMPRESSION(S) / ED DIAGNOSES   Final diagnoses:  Acute cystitis with hematuria     Rx / DC Orders   ED Discharge Orders     None         Note:  This document was prepared using Dragon voice recognition software and may include unintentional dictation errors.   Pia Mau [place], Cordelia Poche 11/25/22 2135    Sharman Cheek, MD 11/28/22 1534

## 2022-11-27 ENCOUNTER — Other Ambulatory Visit: Payer: Self-pay

## 2022-11-28 ENCOUNTER — Other Ambulatory Visit: Payer: Self-pay

## 2022-12-07 ENCOUNTER — Other Ambulatory Visit: Payer: Self-pay

## 2022-12-07 ENCOUNTER — Ambulatory Visit (INDEPENDENT_AMBULATORY_CARE_PROVIDER_SITE_OTHER): Payer: 59 | Admitting: Dermatology

## 2022-12-07 ENCOUNTER — Encounter: Payer: Self-pay | Admitting: Dermatology

## 2022-12-07 DIAGNOSIS — L304 Erythema intertrigo: Secondary | ICD-10-CM

## 2022-12-07 DIAGNOSIS — L669 Cicatricial alopecia, unspecified: Secondary | ICD-10-CM

## 2022-12-07 MED ORDER — KETOCONAZOLE 2 % EX CREA
1.0000 | TOPICAL_CREAM | Freq: Two times a day (BID) | CUTANEOUS | 3 refills | Status: DC | PRN
  Filled 2022-12-07 – 2022-12-08 (×2): qty 60, 30d supply, fill #0
  Filled 2022-12-15: qty 60, 60d supply, fill #0
  Filled 2023-05-29: qty 60, 90d supply, fill #1
  Filled 2023-08-14: qty 60, 60d supply, fill #2
  Filled 2023-09-18 – 2023-10-12 (×4): qty 60, 60d supply, fill #3

## 2022-12-07 MED ORDER — PIMECROLIMUS 1 % EX CREA: TOPICAL_CREAM | CUTANEOUS | 3 refills | Status: DC

## 2022-12-07 NOTE — Patient Instructions (Signed)
Due to recent changes in healthcare laws, you may see results of your pathology and/or laboratory studies on MyChart before the doctors have had a chance to review them. We understand that in some cases there may be results that are confusing or concerning to you. Please understand that not all results are received at the same time and often the doctors may need to interpret multiple results in order to provide you with the best plan of care or course of treatment. Therefore, we ask that you please give [place] 2 business days to thoroughly review all your results before contacting the office for clarification. Should we see a critical lab result, you will be contacted sooner.   If You Need Anything After Your Visit  If you have any questions or concerns for your doctor, please call our main line at [phone] and press option 4 to reach your doctor's medical assistant. If no one answers, please leave a voicemail as directed and we will return your call as soon as possible. Messages left after 4 pm will be answered the following business day.   You may also send [place] a message via MyChart. We typically respond to MyChart messages within 1-2 business days.  For prescription refills, please ask your pharmacy to contact our office. Our fax number is [phone].  If you have an urgent issue when the clinic is closed that cannot wait until the next business day, you can page your doctor at the number below.    Please note that while we do our best to be available for urgent issues outside of office hours, we are not available 24/7.   If you have an urgent issue and are unable to reach [place], you may choose to seek medical care at your doctor's office, retail clinic, urgent care center, or emergency room.  If you have a medical emergency, please immediately call 911 or go to the emergency department.  Pager Numbers  - Dr. Gwen Pounds: [phone]  - Dr. Roseanne Reno: [phone]  - Dr. Katrinka Blazing: [phone]    In the event of inclement weather, please call our main line at [phone] for an update on the status of any delays or closures.  Dermatology Medication Tips: Please keep the boxes that topical medications come in in order to help keep track of the instructions about where and how to use these. Pharmacies typically print the medication instructions only on the boxes and not directly on the medication tubes.   If your medication is too expensive, please contact our office at [phone] option 4 or send [place] a message through MyChart.   We are unable to tell what your co-pay for medications will be in advance as this is different depending on your insurance coverage. However, we may be able to find a substitute medication at lower cost or fill out paperwork to get insurance to cover a needed medication.   If a prior authorization is required to get your medication covered by your insurance company, please allow [place] 1-2 business days to complete this process.  Drug prices often vary depending on where the prescription is filled and some pharmacies may offer cheaper prices.  The website www.goodrx.com contains coupons for medications through different pharmacies. The prices here do not account for what the cost may be with help from insurance (it may be cheaper with your insurance), but the website can give you the price if you did not use any insurance.  - You can print the associated coupon and take it  with your prescription to the pharmacy.  - You may also stop by our office during regular business hours and pick up a GoodRx coupon card.  - If you need your prescription sent electronically to a different pharmacy, notify our office through [facility] or by phone at [phone] option 4.

## 2022-12-07 NOTE — Progress Notes (Signed)
   Follow-Up Visit   Subjective  Olivia Sanchez is a 57 y.o. female who presents for the following: Cicatricial alopecia. Patient is not currently using the clobetasol or taking the doxycycline prescribed at last visit. Patient was without insurance for a while so she was unable to continue them. Her hairdresser thinks that there has been some improvement but patient has not noticed any. She would like to continue with ILK injections today.   The patient has spots, moles and lesions to be evaluated, some may be new or changing and the patient may have concern these could be cancer.   The following portions of the chart were reviewed this encounter and updated as appropriate: medications, allergies, medical history  Review of Systems:  No other skin or systemic complaints except as noted in HPI or Assessment and Plan.  Objective  Well appearing patient in no apparent distress; mood and affect are within normal limits.   A focused examination was performed of the following areas: scalp  Relevant exam findings are noted in the Assessment and Plan.    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 Despite this, hair has diffusely regrown in affected areas. Hairdresser is also able to style hair in a way that affected areas are more discreet. The regrowth of hair in an inflammatory/cicatricial process without treatment suggests the inflammation has resolved. Thus, further treatment is unlikely to be beneficial. Discussed that the only treatment recommended at this point is minoxidil, but it has a risk of hypertrichosis, temporary hair loss, and hair loss after stopping. Patient prefers to avoid minoxidil. Jointly decided to defer treatment Patient  will return if worsening        Erythema intertrigo  Related Medications pimecrolimus (ELIDEL) 1 % cream Apply twice daily to affected areas as needed for rash  ketoconazole (NIZORAL) 2 % cream Apply twice daily to affected areas as needed for rash  Patient requested refills for intertrigo  Return if symptoms worsen or fail to improve.  Anise Salvo, RMA, am acting as scribe for Elie Goody, MD .   Documentation: I have reviewed the above documentation for accuracy and completeness, and I agree with the above.  Elie Goody, MD

## 2022-12-08 ENCOUNTER — Other Ambulatory Visit: Payer: Self-pay

## 2022-12-08 ENCOUNTER — Emergency Department
Admission: EM | Admit: 2022-12-08 | Discharge: 2022-12-08 | Disposition: A | Discharge status: Home / Self Care | Payer: 59 | Attending: Emergency Medicine | Admitting: Emergency Medicine

## 2022-12-08 ENCOUNTER — Encounter: Payer: Self-pay | Admitting: Emergency Medicine

## 2022-12-08 DIAGNOSIS — I10 Essential (primary) hypertension: Secondary | ICD-10-CM | POA: Diagnosis not present

## 2022-12-08 DIAGNOSIS — M25512 Pain in left shoulder: Secondary | ICD-10-CM | POA: Insufficient documentation

## 2022-12-08 MED ORDER — TRAMADOL HCL 50 MG PO TABS
50.0000 mg | ORAL_TABLET | Freq: Four times a day (QID) | ORAL | 0 refills | Status: DC | PRN
Start: 2022-12-08 — End: 2023-11-20
  Filled 2022-12-08: qty 15, 4d supply, fill #0

## 2022-12-08 MED ORDER — METHYLPREDNISOLONE 4 MG PO TBPK
ORAL_TABLET | ORAL | 0 refills | Status: AC
Start: 2022-12-08 — End: 2022-12-14
  Filled 2022-12-08 (×2): qty 21, 6d supply, fill #0

## 2022-12-08 NOTE — ED Triage Notes (Signed)
Pt comes with c/o left arm pain for two days. 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 Pt denies any known injuries . Pt states no cp, sob or numbness.

## 2022-12-08 NOTE — ED Provider Notes (Signed)
[facility] Provider Note    Event Date/Time   First MD Initiated Contact with Patient 12/08/22 [phone]     (approximate)   History   Arm Pain   HPI  HALIE BRIN is a 57 y.o. female with history of hypertension, hypercholesterolemia, thyroid disease presents emergency department complaining of left shoulder pain.  States painful to lift the left arm for the past 2 days.  No known injury.  She does work as a Engineer, civil (consulting).  No numbness or tingling.  States it does hurt to sleep on it.  History of rotator cuff tear in the right shoulder.  Took a muscle relaxer and Lodine without any relief.      Physical Exam   Triage Vital Signs: ED Triage Vitals  Encounter Vitals Group     BP 12/08/22 0753 (!) 143/91     Systolic BP Percentile --      Diastolic BP Percentile --      Pulse Rate 12/08/22 0753 92     Resp 12/08/22 0753 18     Temp 12/08/22 0753 98.7 F (37.1 C)     Temp src --      SpO2 12/08/22 0753 100 %     Weight 12/08/22 0751 183 lb (83 kg)     Height 12/08/22 0751 5' (1.524 m)     Head Circumference --      Peak Flow --      Pain Score 12/08/22 0751 7     Pain Loc --      Pain Education --      Exclude from Growth Chart --     Most recent vital signs: Vitals:   12/08/22 0753  BP: (!) 143/91  Pulse: 92  Resp: 18  Temp: 98.7 F (37.1 C)  SpO2: 100%     General: Awake, no distress.   CV:  Good peripheral perfusion. regular rate and  rhythm Resp:  Normal effort. Abd:  No distention.   Other:  Left shoulder with decreased range of motion, is able to internally rotate without difficulty, cannot lift the arm overhead or to the side, grips equal bilaterally, neurovascular   ED Results / Procedures / Treatments   Labs (all labs ordered are listed, but only abnormal results are displayed) Labs Reviewed - No data to display   EKG     RADIOLOGY     PROCEDURES:   Procedures   MEDICATIONS ORDERED IN ED: Medications - No  data to display   IMPRESSION / MDM / ASSESSMENT AND PLAN / ED COURSE  I reviewed the triage vital signs and the nursing notes.                              Differential diagnosis includes, but is not limited to, adhesive capsulitis, frozen shoulder, tendinitis, bursitis  Patient's presentation is most consistent with acute complicated illness / injury requiring diagnostic workup.   Patient's exam is consistent with adhesive capsulitis.  Placed her in a sling for comfort.  She is to use ice.  Was given a prescription for prednisone and tramadol.  She is to follow-up with orthopedics.  Return emergency department worsening.  Strict instructions to continue to move the shoulder around and did give her shoulder exercises.  Work note provided and discharged stable condition.      FINAL CLINICAL IMPRESSION(S) / ED DIAGNOSES   Final diagnoses:  Acute pain of left shoulder  Rx / DC Orders   ED Discharge Orders          Ordered    methylPREDNISolone (MEDROL DOSEPAK) 4 MG TBPK tablet        12/08/22 0347             Note:  This document was prepared using Dragon voice recognition software and may include unintentional dictation errors.    Faythe Ghee, PA-C 12/08/22 1150    Corena Herter, MD 12/08/22 [phone]

## 2022-12-13 ENCOUNTER — Other Ambulatory Visit: Payer: Self-pay

## 2022-12-13 MED ORDER — TRAMADOL HCL 50 MG PO TABS
50.0000 mg | ORAL_TABLET | Freq: Four times a day (QID) | ORAL | 0 refills | Status: DC | PRN
  Filled 2022-12-13: qty 30, 8d supply, fill #0

## 2022-12-15 ENCOUNTER — Other Ambulatory Visit: Payer: Self-pay

## 2022-12-18 ENCOUNTER — Other Ambulatory Visit: Payer: Self-pay

## 2022-12-28 ENCOUNTER — Other Ambulatory Visit: Payer: Self-pay | Admitting: Internal Medicine

## 2022-12-28 DIAGNOSIS — Z1231 Encounter for screening mammogram for malignant neoplasm of breast: Secondary | ICD-10-CM

## 2023-01-03 ENCOUNTER — Other Ambulatory Visit: Payer: Self-pay

## 2023-01-04 ENCOUNTER — Other Ambulatory Visit: Payer: Self-pay

## 2023-01-04 MED ORDER — TIMOLOL MALEATE 0.5 % OP SOLN
1.0000 [drp] | Freq: Two times a day (BID) | OPHTHALMIC | 3 refills | Status: DC
  Filled 2023-01-04: qty 5, 30d supply, fill #0

## 2023-01-04 MED ORDER — LATANOPROST 0.005 % OP SOLN
1.0000 [drp] | Freq: Every evening | OPHTHALMIC | 3 refills | Status: DC
  Filled 2023-01-04: qty 2.5, 20d supply, fill #0
  Filled 2023-04-11: qty 2.5, 20d supply, fill #1
  Filled 2023-05-21: qty 2.5, 20d supply, fill #2
  Filled 2023-06-28: qty 2.5, 20d supply, fill #3

## 2023-01-05 ENCOUNTER — Other Ambulatory Visit (HOSPITAL_BASED_OUTPATIENT_CLINIC_OR_DEPARTMENT_OTHER): Payer: Self-pay

## 2023-01-23 ENCOUNTER — Other Ambulatory Visit: Payer: Self-pay

## 2023-01-23 MED ORDER — METHYLPHENIDATE HCL 20 MG PO TABS
20.0000 mg | ORAL_TABLET | Freq: Two times a day (BID) | ORAL | 0 refills | Status: DC
  Filled 2023-01-23: qty 180, 90d supply, fill #0

## 2023-01-23 MED ORDER — QUETIAPINE FUMARATE 50 MG PO TABS
50.0000 mg | ORAL_TABLET | Freq: Every day | ORAL | 5 refills | Status: DC
  Filled 2023-01-23: qty 30, 30d supply, fill #0
  Filled 2023-04-11: qty 30, 30d supply, fill #1
  Filled 2023-05-17: qty 30, 30d supply, fill #2
  Filled 2023-06-19: qty 30, 30d supply, fill #3
  Filled 2023-07-24: qty 30, 30d supply, fill #4
  Filled 2023-08-22: qty 30, 30d supply, fill #5

## 2023-01-23 MED ORDER — AMITRIPTYLINE HCL 25 MG PO TABS
25.0000 mg | ORAL_TABLET | Freq: Every day | ORAL | 11 refills | Status: DC
  Filled 2023-01-23: qty 30, 30d supply, fill #0
  Filled 2023-04-11: qty 30, 30d supply, fill #1
  Filled 2023-05-17: qty 30, 30d supply, fill #2
  Filled 2023-06-19: qty 30, 30d supply, fill #3
  Filled 2023-07-24: qty 30, 30d supply, fill #4
  Filled 2023-08-22: qty 30, 30d supply, fill #5
  Filled 2023-09-25: qty 30, 30d supply, fill #6

## 2023-01-24 ENCOUNTER — Other Ambulatory Visit: Payer: Self-pay

## 2023-01-25 ENCOUNTER — Other Ambulatory Visit: Payer: Self-pay

## 2023-02-06 ENCOUNTER — Other Ambulatory Visit: Payer: Self-pay | Admitting: Internal Medicine

## 2023-02-06 DIAGNOSIS — R079 Chest pain, unspecified: Secondary | ICD-10-CM

## 2023-02-14 ENCOUNTER — Ambulatory Visit
Admission: RE | Admit: 2023-02-14 | Discharge: 2023-02-14 | Disposition: A | Discharge status: Home / Self Care | Payer: 59 | Source: Ambulatory Visit | Attending: Internal Medicine | Admitting: Internal Medicine

## 2023-02-14 DIAGNOSIS — R079 Chest pain, unspecified: Secondary | ICD-10-CM | POA: Insufficient documentation

## 2023-03-19 ENCOUNTER — Other Ambulatory Visit: Payer: Self-pay

## 2023-03-20 ENCOUNTER — Other Ambulatory Visit: Payer: Self-pay

## 2023-04-11 ENCOUNTER — Other Ambulatory Visit: Payer: Self-pay

## 2023-04-11 MED ORDER — ALPRAZOLAM 0.25 MG PO TABS
0.2500 mg | ORAL_TABLET | Freq: Four times a day (QID) | ORAL | 5 refills | Status: DC | PRN
  Filled 2023-04-11: qty 60, 15d supply, fill #0
  Filled 2023-05-17: qty 60, 15d supply, fill #1
  Filled 2023-06-19: qty 60, 15d supply, fill #2
  Filled 2023-08-08: qty 60, 15d supply, fill #3
  Filled 2023-09-17: qty 60, 15d supply, fill #4

## 2023-04-11 MED ORDER — PROPRANOLOL HCL ER 80 MG PO CP24
80.0000 mg | ORAL_CAPSULE | Freq: Every day | ORAL | 11 refills | Status: AC
  Filled 2023-04-11: qty 30, 30d supply, fill #0
  Filled 2023-05-29: qty 30, 30d supply, fill #1
  Filled 2023-07-04: qty 30, 30d supply, fill #2
  Filled 2023-08-09: qty 30, 30d supply, fill #3
  Filled 2023-09-11: qty 30, 30d supply, fill #4
  Filled 2023-10-22: qty 30, 30d supply, fill #5
  Filled 2023-12-03: qty 30, 30d supply, fill #6
  Filled 2024-01-02: qty 30, 30d supply, fill #7
  Filled 2024-02-18: qty 30, 30d supply, fill #8
  Filled 2024-04-02: qty 30, 30d supply, fill #9

## 2023-04-12 ENCOUNTER — Other Ambulatory Visit: Payer: Self-pay

## 2023-05-07 ENCOUNTER — Other Ambulatory Visit: Payer: Self-pay

## 2023-05-07 MED ORDER — BUPROPION HCL ER (XL) 300 MG PO TB24
300.0000 mg | ORAL_TABLET | Freq: Every day | ORAL | 2 refills | Status: DC
  Filled 2023-05-07: qty 90, 90d supply, fill #0
  Filled 2023-08-08: qty 90, 90d supply, fill #1

## 2023-05-07 MED ORDER — TOPIRAMATE 100 MG PO TABS
100.0000 mg | ORAL_TABLET | Freq: Every day | ORAL | 3 refills | Status: DC
  Filled 2023-05-07: qty 90, 90d supply, fill #0

## 2023-05-07 MED ORDER — OMEPRAZOLE 20 MG PO CPDR
20.0000 mg | DELAYED_RELEASE_CAPSULE | Freq: Two times a day (BID) | ORAL | 1 refills | Status: DC
  Filled 2023-05-07: qty 180, 90d supply, fill #0
  Filled 2023-08-08: qty 180, 90d supply, fill #1

## 2023-05-17 ENCOUNTER — Other Ambulatory Visit: Payer: Self-pay

## 2023-05-21 ENCOUNTER — Other Ambulatory Visit: Payer: Self-pay

## 2023-05-22 ENCOUNTER — Other Ambulatory Visit: Payer: Self-pay

## 2023-05-22 MED ORDER — METHYLPHENIDATE HCL 20 MG PO TABS
20.0000 mg | ORAL_TABLET | Freq: Two times a day (BID) | ORAL | 0 refills | Status: DC
  Filled 2023-05-22: qty 180, 90d supply, fill #0

## 2023-05-30 ENCOUNTER — Other Ambulatory Visit: Payer: Self-pay

## 2023-05-31 ENCOUNTER — Other Ambulatory Visit: Payer: Self-pay

## 2023-06-19 ENCOUNTER — Other Ambulatory Visit: Payer: Self-pay

## 2023-06-20 ENCOUNTER — Other Ambulatory Visit: Payer: Self-pay

## 2023-06-20 MED ORDER — FLUCONAZOLE 100 MG PO TABS
100.0000 mg | ORAL_TABLET | Freq: Every day | ORAL | 0 refills | Status: DC
  Filled 2023-06-20: qty 3, 3d supply, fill #0

## 2023-06-25 ENCOUNTER — Other Ambulatory Visit: Payer: Self-pay

## 2023-06-25 MED ORDER — NURTEC 75 MG PO TBDP
75.0000 mg | ORAL_TABLET | ORAL | 3 refills | Status: DC | PRN
  Filled 2023-06-25: qty 16, 30d supply, fill #0

## 2023-06-25 MED ORDER — NURTEC 75 MG PO TBDP
75.0000 mg | ORAL_TABLET | ORAL | 3 refills | Status: DC | PRN
Start: 2023-06-25 — End: 2023-11-20
  Filled 2023-06-25: qty 16, 32d supply, fill #0
  Filled 2023-06-27 – 2023-06-28 (×4): qty 16, 30d supply, fill #0
  Filled 2023-09-17: qty 16, 30d supply, fill #1

## 2023-06-25 MED ORDER — TOPIRAMATE 100 MG PO TABS
100.0000 mg | ORAL_TABLET | Freq: Every evening | ORAL | 3 refills | Status: DC
  Filled 2023-06-25: qty 90, 90d supply, fill #0
  Filled 2023-10-29: qty 90, 90d supply, fill #1

## 2023-06-25 MED ORDER — SUMATRIPTAN SUCCINATE 100 MG PO TABS
100.0000 mg | ORAL_TABLET | ORAL | 3 refills | Status: DC | PRN
  Filled 2023-06-25: qty 9, 30d supply, fill #0

## 2023-06-25 MED ORDER — TOPIRAMATE 50 MG PO TABS
50.0000 mg | ORAL_TABLET | Freq: Every morning | ORAL | 3 refills | Status: DC
  Filled 2023-06-25: qty 90, 90d supply, fill #0

## 2023-06-27 ENCOUNTER — Other Ambulatory Visit: Payer: Self-pay

## 2023-06-28 ENCOUNTER — Ambulatory Visit
Admission: RE | Admit: 2023-06-28 | Discharge: 2023-06-28 | Disposition: A | Discharge status: Home / Self Care | Source: Ambulatory Visit | Attending: Internal Medicine | Admitting: Internal Medicine

## 2023-06-28 ENCOUNTER — Other Ambulatory Visit: Payer: Self-pay

## 2023-06-28 DIAGNOSIS — Z1231 Encounter for screening mammogram for malignant neoplasm of breast: Secondary | ICD-10-CM | POA: Insufficient documentation

## 2023-07-06 ENCOUNTER — Other Ambulatory Visit: Payer: Self-pay

## 2023-07-11 ENCOUNTER — Other Ambulatory Visit: Payer: Self-pay

## 2023-07-11 MED ORDER — PREDNISONE 10 MG PO TABS
ORAL_TABLET | ORAL | 0 refills | Status: DC
  Filled 2023-07-11: qty 30, 12d supply, fill #0

## 2023-07-11 MED ORDER — CYCLOBENZAPRINE HCL 5 MG PO TABS
5.0000 mg | ORAL_TABLET | Freq: Three times a day (TID) | ORAL | 0 refills | Status: AC | PRN
  Filled 2023-07-11: qty 21, 7d supply, fill #0

## 2023-07-23 ENCOUNTER — Other Ambulatory Visit: Payer: Self-pay

## 2023-07-27 ENCOUNTER — Other Ambulatory Visit: Payer: Self-pay

## 2023-08-08 ENCOUNTER — Other Ambulatory Visit: Payer: Self-pay

## 2023-08-09 ENCOUNTER — Other Ambulatory Visit: Payer: Self-pay

## 2023-08-10 ENCOUNTER — Other Ambulatory Visit: Payer: Self-pay

## 2023-08-10 MED ORDER — LATANOPROST 0.005 % OP SOLN
1.0000 [drp] | Freq: Every day | OPHTHALMIC | 0 refills | Status: DC
  Filled 2023-08-10: qty 7.5, 150d supply, fill #0
  Filled 2023-08-10: qty 2.5, 40d supply, fill #0
  Filled 2023-08-10: qty 7.5, 150d supply, fill #0
  Filled 2023-09-17: qty 2.5, 40d supply, fill #1
  Filled 2023-11-12: qty 2.5, 40d supply, fill #2

## 2023-08-13 ENCOUNTER — Other Ambulatory Visit: Payer: Self-pay

## 2023-08-15 ENCOUNTER — Other Ambulatory Visit: Payer: Self-pay

## 2023-08-16 ENCOUNTER — Other Ambulatory Visit: Payer: Self-pay

## 2023-08-16 MED ORDER — ROSUVASTATIN CALCIUM 10 MG PO TABS
10.0000 mg | ORAL_TABLET | Freq: Every day | ORAL | 3 refills | Status: AC
  Filled 2023-08-16: qty 90, 90d supply, fill #0
  Filled 2023-12-03: qty 90, 90d supply, fill #1
  Filled 2024-04-02: qty 90, 90d supply, fill #2

## 2023-08-23 ENCOUNTER — Other Ambulatory Visit: Payer: Self-pay

## 2023-08-24 ENCOUNTER — Other Ambulatory Visit: Payer: Self-pay

## 2023-08-24 MED ORDER — METHYLPHENIDATE HCL 20 MG PO TABS
20.0000 mg | ORAL_TABLET | Freq: Two times a day (BID) | ORAL | 0 refills | Status: DC
  Filled 2023-08-24: qty 180, 90d supply, fill #0

## 2023-08-27 ENCOUNTER — Other Ambulatory Visit: Payer: Self-pay

## 2023-09-14 ENCOUNTER — Other Ambulatory Visit: Payer: Self-pay

## 2023-09-18 ENCOUNTER — Other Ambulatory Visit: Payer: Self-pay

## 2023-09-19 ENCOUNTER — Other Ambulatory Visit: Payer: Self-pay

## 2023-09-26 ENCOUNTER — Other Ambulatory Visit: Payer: Self-pay

## 2023-09-27 ENCOUNTER — Other Ambulatory Visit: Payer: Self-pay

## 2023-10-03 ENCOUNTER — Other Ambulatory Visit: Payer: Self-pay

## 2023-10-08 ENCOUNTER — Other Ambulatory Visit: Payer: Self-pay

## 2023-10-08 MED ORDER — TRAMADOL HCL 50 MG PO TABS
50.0000 mg | ORAL_TABLET | Freq: Four times a day (QID) | ORAL | 0 refills | Status: DC | PRN
  Filled 2023-10-08: qty 12, 3d supply, fill #0

## 2023-10-08 MED ORDER — PREDNISONE 20 MG PO TABS
20.0000 mg | ORAL_TABLET | Freq: Two times a day (BID) | ORAL | 0 refills | Status: AC
  Filled 2023-10-08: qty 10, 5d supply, fill #0

## 2023-10-12 ENCOUNTER — Other Ambulatory Visit: Payer: Self-pay

## 2023-10-15 ENCOUNTER — Other Ambulatory Visit: Payer: Self-pay

## 2023-10-16 ENCOUNTER — Other Ambulatory Visit: Payer: Self-pay

## 2023-10-16 MED ORDER — ETODOLAC 400 MG PO TABS
400.0000 mg | ORAL_TABLET | Freq: Two times a day (BID) | ORAL | 3 refills | Status: AC
  Filled 2023-10-16: qty 180, 90d supply, fill #0
  Filled 2024-02-18: qty 180, 90d supply, fill #1

## 2023-10-18 ENCOUNTER — Other Ambulatory Visit: Payer: Self-pay

## 2023-10-18 MED ORDER — NURTEC 75 MG PO TBDP
75.0000 mg | ORAL_TABLET | ORAL | 3 refills | Status: DC
  Filled 2023-10-18: qty 16, 32d supply, fill #0
  Filled 2023-11-08: qty 8, 30d supply, fill #0

## 2023-10-21 ENCOUNTER — Other Ambulatory Visit: Payer: Self-pay

## 2023-10-22 ENCOUNTER — Other Ambulatory Visit: Payer: Self-pay

## 2023-10-23 ENCOUNTER — Other Ambulatory Visit: Payer: Self-pay

## 2023-10-23 MED ORDER — ALPRAZOLAM 0.25 MG PO TABS
0.2500 mg | ORAL_TABLET | Freq: Four times a day (QID) | ORAL | 5 refills | Status: AC | PRN
  Filled 2023-10-23: qty 60, 15d supply, fill #0
  Filled 2023-11-27: qty 60, 15d supply, fill #1
  Filled 2024-01-02: qty 60, 15d supply, fill #2
  Filled 2024-02-07: qty 60, 15d supply, fill #3
  Filled 2024-03-10: qty 60, 15d supply, fill #4

## 2023-10-30 ENCOUNTER — Other Ambulatory Visit: Payer: Self-pay

## 2023-11-05 ENCOUNTER — Other Ambulatory Visit: Payer: Self-pay

## 2023-11-05 MED ORDER — METHOCARBAMOL 500 MG PO TABS
500.0000 mg | ORAL_TABLET | Freq: Two times a day (BID) | ORAL | 3 refills | Status: AC
  Filled 2023-11-05: qty 180, 90d supply, fill #0
  Filled 2024-04-02: qty 180, 90d supply, fill #1

## 2023-11-05 MED ORDER — OMEPRAZOLE 20 MG PO CPDR
20.0000 mg | DELAYED_RELEASE_CAPSULE | Freq: Two times a day (BID) | ORAL | 1 refills | Status: AC
  Filled 2023-11-05: qty 180, 90d supply, fill #0
  Filled 2024-02-18: qty 180, 90d supply, fill #1

## 2023-11-05 MED ORDER — PREDNISONE 10 MG PO TABS
ORAL_TABLET | ORAL | 0 refills | Status: DC
  Filled 2023-11-05: qty 21, 6d supply, fill #0

## 2023-11-05 MED ORDER — GABAPENTIN 300 MG PO CAPS
300.0000 mg | ORAL_CAPSULE | Freq: Every day | ORAL | 0 refills | Status: DC
  Filled 2023-11-05: qty 30, 30d supply, fill #0

## 2023-11-05 MED ORDER — TOPIRAMATE 50 MG PO TABS
50.0000 mg | ORAL_TABLET | Freq: Every morning | ORAL | 3 refills | Status: AC
  Filled 2023-11-05 – 2023-12-18 (×2): qty 90, 90d supply, fill #0
  Filled 2024-04-02: qty 90, 90d supply, fill #1

## 2023-11-05 MED ORDER — BUPROPION HCL ER (XL) 300 MG PO TB24
300.0000 mg | ORAL_TABLET | Freq: Every day | ORAL | 2 refills | Status: AC
  Filled 2023-11-05: qty 90, 90d supply, fill #0
  Filled 2024-02-18: qty 90, 90d supply, fill #1

## 2023-11-05 MED ORDER — VALSARTAN 160 MG PO TABS
160.0000 mg | ORAL_TABLET | Freq: Every day | ORAL | 3 refills | Status: AC
  Filled 2023-11-05: qty 90, 90d supply, fill #0
  Filled 2024-04-15: qty 90, 90d supply, fill #1

## 2023-11-08 ENCOUNTER — Other Ambulatory Visit: Payer: Self-pay

## 2023-11-13 ENCOUNTER — Other Ambulatory Visit: Payer: Self-pay

## 2023-11-18 NOTE — Progress Notes (Unsigned)
 PCP:  Auston Reyes BIRCH, MD   No chief complaint on file.    HPI:      [name] is a 58 y.o. G0P0000 who LMP was No LMP recorded (lmp unknown). Patient is postmenopausal., presents today for her annual examination.  Her menses are absent for over a year with POPs. No dysmen. Pt on camila  for cycle control with leio. Stopped POPs 2024****  Sex activity: not sexually active. No vag sx.  Last Pap: 07/04/22 Results were NILM/neg HPV DNA.  06/16/21 Results were NILM Results were: no abnormalities  04/06/20 Results were normal 09/30/19 Results were normal 04/02/19 Results were normal 03/29/18 colpo bx with CIN 1  03/13/18 ASC-H/neg HPV DNA Hx of STDs: HSV on labs only, never any sx. Neg STD testing last yr. Wants full STD testing; no recent sexual activity.   Last mammogram: 06/28/23  Results were: normal--routine follow-up in 12 months There is a FH of breast cancer in her mat aunt, genetic testing not indicated. There is no FH of ovarian cancer. The patient does self-breast exams.  Tobacco use: The patient denies current or previous tobacco use. Alcohol use: social drinker No drug use.  Exercise: not active  Colonoscopy: 2015 without abn findings at [facility]. Repeat after 10 yrs per pt report.  She does get adequate calcium  and Vitamin D in her diet.  Labs with PCP. Being treated for depression/medications.    Past Medical History:  Diagnosis Date   Arthritis    Depression    Esophageal reflux    Family history of adverse reaction to anesthesia    maternal aunt and maternal uncle and aunts daughter have malignant hyperthermia   Fibroids    Glaucoma    Herpes genitalia    Hypercholesteremia    Hypertension    Malignant hyperthermia    HUGE FAMILY HISTORY OF MH-PTS MATERNAL AUNT AND UNCLE AND COUSIN-PT HAS NEVER HAD ANY ISSUES BUT SHE HAS ONLY HAD COLONOSCOPY AND EGD   Thyroid  disease     Past Surgical History:  Procedure Laterality Date   COLONOSCOPY  02/2014    Dr.Elliott   CYSTOSCOPY     EAR CYST EXCISION Left 04/21/2019   Procedure: EXCISION OF CYST FROM LEFT KNEE;  Surgeon: Mardee Lynwood SQUIBB, MD;  Location: [facility] ORS;  Service: Orthopedics;  Laterality: Left;   ESOPHAGOGASTRODUODENOSCOPY      Family History  Problem Relation Age of Onset   Dementia Mother        senile, early stages   Hypertension Mother    Breast cancer Maternal Aunt 53    Social History   Socioeconomic History   Marital status: Single    Spouse name: Not on file   Number of children: Not on file   Years of education: Not on file   Highest education level: Not on file  Occupational History   Not on file  Tobacco Use   Smoking status: Never   Smokeless tobacco: Never  Vaping Use   Vaping status: Never Used  Substance and Sexual Activity   Alcohol use: Not Currently   Drug use: No   Sexual activity: Not Currently    Partners: Male    Birth control/protection: None  Other Topics Concern   Not on file  Social History Narrative   Not on file   Social Drivers of Health   Financial Resource Strain: Medium Risk (08/15/2023)   Received from [facility]   Overall Financial Resource Strain (  CARDIA)    Difficulty of Paying Living Expenses: Somewhat hard  Food Insecurity: No Food Insecurity (08/15/2023)   Received from [facility]   Hunger Vital Sign    Within the past 12 months, you worried that your food would run out before you got the money to buy more.: Never true    Within the past 12 months, the food you bought just didn't last and you didn't have money to get more.: Never true  Transportation Needs: No Transportation Needs (08/15/2023)   Received from [facility] - Transportation    In the past 12 months, has lack of transportation kept you from medical appointments or from getting medications?: No    Lack of Transportation (Non-Medical): No  Physical Activity: Not on file  Stress: Not on file   Social Connections: Not on file  Intimate Partner Violence: Not on file    No outpatient medications have been marked as taking for the 11/20/23 encounter (Appointment) with Tzipporah Nagorski, Bernarda NOVAK, PA-C.     ROS:  Review of Systems  Constitutional:  Negative for fatigue, fever and unexpected weight change.  Respiratory:  Negative for cough, shortness of breath and wheezing.   Cardiovascular:  Negative for chest pain, palpitations and leg swelling.  Gastrointestinal:  Negative for blood in stool, constipation, diarrhea, nausea and vomiting.  Endocrine: Negative for cold intolerance, heat intolerance and polyuria.  Genitourinary:  Negative for dyspareunia, dysuria, flank pain, frequency, genital sores, hematuria, menstrual problem, pelvic pain, urgency, vaginal bleeding, vaginal discharge and vaginal pain.  Musculoskeletal:  Negative for back pain, joint swelling and myalgias.  Skin:  Negative for rash.  Neurological:  Positive for headaches. Negative for dizziness, syncope, light-headedness and numbness.  Hematological:  Negative for adenopathy.  Psychiatric/Behavioral:  Positive for dysphoric mood. Negative for agitation, confusion, sleep disturbance and suicidal ideas. The patient is not nervous/anxious.      Objective: LMP  (LMP Unknown)    Physical Exam Constitutional:      Appearance: She is well-developed.  Genitourinary:     Vulva normal.     Right Labia: No rash, tenderness or lesions.    Left Labia: No tenderness, lesions or rash.    No vaginal discharge, erythema, tenderness or bleeding.      Right Adnexa: not tender and no mass present.    Left Adnexa: not tender and no mass present.    No cervical motion tenderness, friability or polyp.     Uterus is enlarged.     Uterus is not tender.  Breasts:    Right: No mass, nipple discharge, skin change or tenderness.     Left: No mass, nipple discharge, skin change or tenderness.  Neck:     Thyroid : No thyromegaly.   Cardiovascular:     Rate and Rhythm: Normal rate and regular rhythm.     Heart sounds: Normal heart sounds. No murmur heard. Pulmonary:     Effort: Pulmonary effort is normal.     Breath sounds: Normal breath sounds.  Abdominal:     Palpations: Abdomen is soft.     Tenderness: There is no abdominal tenderness. There is no guarding or rebound.  Musculoskeletal:        General: Normal range of motion.     Cervical back: Normal range of motion.  Lymphadenopathy:     Cervical: No cervical adenopathy.  Neurological:     General: No focal deficit present.     Mental Status: She is  alert and oriented to person, place, and time.     Cranial Nerves: No cranial nerve deficit.  Skin:    General: Skin is warm and dry.  Psychiatric:        Mood and Affect: Mood normal.        Behavior: Behavior normal.        Thought Content: Thought content normal.        Judgment: Judgment normal.  Vitals reviewed.     Assessment/Plan: Encounter for annual routine gynecological examination  Cervical cancer screening - Plan: Cytology - PAP  Screening for STD (sexually transmitted disease) - Plan: HIV Antibody (routine testing w rflx), RPR Qual, Hepatitis C antibody, Cytology - PAP  Screening for HPV (human papillomavirus) - Plan: Cytology - PAP  Dysplasia of cervix, low grade (CIN 1) - Plan: Cytology - PAP  Encounter for screening mammogram for malignant neoplasm of breast - Plan: MM 3D SCREENING MAMMOGRAM BILATERAL BREAST; pt to schedule mammo  Fibroids - pt to stop POPs for now since no menses for a yr. If bleeding resumes, will restart POPs. Pt to f/u prn.            GYN counsel breast self exam, mammography screening, adequate intake of calcium  and vitamin D, diet and exercise     F/U  No follow-ups on file.  Shai Mckenzie B. Kenlie Seki, PA-C 11/18/2023 4:49 PM

## 2023-11-20 ENCOUNTER — Ambulatory Visit: Admitting: Obstetrics and Gynecology

## 2023-11-20 ENCOUNTER — Encounter: Payer: Self-pay | Admitting: Obstetrics and Gynecology

## 2023-11-20 VITALS — BP 118/77 | HR 83 | Ht 60.0 in | Wt 197.0 lb

## 2023-11-20 DIAGNOSIS — D259 Leiomyoma of uterus, unspecified: Secondary | ICD-10-CM

## 2023-11-20 DIAGNOSIS — Z01419 Encounter for gynecological examination (general) (routine) without abnormal findings: Secondary | ICD-10-CM

## 2023-11-20 DIAGNOSIS — D219 Benign neoplasm of connective and other soft tissue, unspecified: Secondary | ICD-10-CM

## 2023-11-20 DIAGNOSIS — Z3041 Encounter for surveillance of contraceptive pills: Secondary | ICD-10-CM

## 2023-11-20 DIAGNOSIS — Z1231 Encounter for screening mammogram for malignant neoplasm of breast: Secondary | ICD-10-CM

## 2023-11-20 DIAGNOSIS — Z113 Encounter for screening for infections with a predominantly sexual mode of transmission: Secondary | ICD-10-CM

## 2023-11-20 DIAGNOSIS — Z87440 Personal history of urinary (tract) infections: Secondary | ICD-10-CM | POA: Diagnosis not present

## 2023-11-20 DIAGNOSIS — Z1211 Encounter for screening for malignant neoplasm of colon: Secondary | ICD-10-CM

## 2023-11-20 LAB — POCT URINALYSIS DIPSTICK
Bilirubin, UA: POSITIVE
Blood, UA: NEGATIVE
Glucose, UA: NEGATIVE
Ketones, UA: NEGATIVE
Leukocytes, UA: NEGATIVE
Nitrite, UA: NEGATIVE
Protein, UA: NEGATIVE
Spec Grav, UA: 1.01 (ref 1.010–1.025)
pH, UA: 6 (ref 5.0–8.0)

## 2023-11-20 NOTE — Patient Instructions (Signed)
 I value your feedback and you entrusting [place] with your care. If you get a [place] patient survey, I would appreciate you taking the time to let us know about your experience today. Thank you! ? ? ?

## 2023-11-21 LAB — HIV ANTIBODY (ROUTINE TESTING W REFLEX): HIV Screen 4th Generation wRfx: NONREACTIVE

## 2023-11-21 LAB — RPR: RPR Ser Ql: NONREACTIVE

## 2023-11-21 LAB — HEPATITIS C ANTIBODY: Hep C Virus Ab: NONREACTIVE

## 2023-11-23 ENCOUNTER — Other Ambulatory Visit: Payer: Self-pay

## 2023-11-23 MED ORDER — TIMOLOL MALEATE 0.5 % OP SOLN
1.0000 [drp] | Freq: Two times a day (BID) | OPHTHALMIC | 3 refills | Status: AC
  Filled 2023-11-23: qty 5, 50d supply, fill #0
  Filled 2024-01-15: qty 5, 50d supply, fill #1
  Filled 2024-03-26: qty 5, 50d supply, fill #2

## 2023-11-23 MED ORDER — LATANOPROST 0.005 % OP SOLN
1.0000 [drp] | Freq: Every day | OPHTHALMIC | 3 refills | Status: AC
  Filled 2023-11-23: qty 7.5, 150d supply, fill #0
  Filled 2023-11-23: qty 7.5, 75d supply, fill #0
  Filled 2023-12-03: qty 7.5, 150d supply, fill #0
  Filled 2023-12-13: qty 2.5, 50d supply, fill #0
  Filled 2024-02-24: qty 2.5, 50d supply, fill #1
  Filled 2024-04-15: qty 2.5, 50d supply, fill #2

## 2023-11-28 ENCOUNTER — Other Ambulatory Visit: Payer: Self-pay

## 2023-11-29 ENCOUNTER — Other Ambulatory Visit: Payer: Self-pay

## 2023-11-29 MED ORDER — QULIPTA 60 MG PO TABS
60.0000 mg | ORAL_TABLET | Freq: Every day | ORAL | 5 refills | Status: DC
  Filled 2023-11-29 – 2023-12-06 (×4): qty 30, 30d supply, fill #0

## 2023-12-03 ENCOUNTER — Ambulatory Visit: Admitting: Dermatology

## 2023-12-03 ENCOUNTER — Other Ambulatory Visit: Payer: Self-pay

## 2023-12-03 ENCOUNTER — Encounter: Payer: Self-pay | Admitting: Dermatology

## 2023-12-03 DIAGNOSIS — L304 Erythema intertrigo: Secondary | ICD-10-CM | POA: Diagnosis not present

## 2023-12-03 DIAGNOSIS — I10 Essential (primary) hypertension: Secondary | ICD-10-CM | POA: Insufficient documentation

## 2023-12-03 DIAGNOSIS — L6681 Central centrifugal cicatricial alopecia: Secondary | ICD-10-CM | POA: Diagnosis not present

## 2023-12-03 MED ORDER — KETOCONAZOLE 2 % EX CREA
1.0000 | TOPICAL_CREAM | Freq: Two times a day (BID) | CUTANEOUS | 11 refills | Status: AC | PRN
  Filled 2023-12-03: qty 60, 30d supply, fill #0
  Filled 2024-01-15: qty 60, 30d supply, fill #1
  Filled 2024-03-10: qty 60, 30d supply, fill #2
  Filled 2024-05-01: qty 60, 30d supply, fill #3

## 2023-12-03 MED ORDER — PIMECROLIMUS 1 % EX CREA: TOPICAL_CREAM | CUTANEOUS | 11 refills | Status: DC

## 2023-12-03 NOTE — Patient Instructions (Addendum)
 Recommend OTC Zeasorb AF powder to body folds daily after shower.  It is often found in the athlete's foot section in the pharmacy.  Avoid using powders that contain cornstarch.  Recommend minoxidil 5% (Rogaine for men) solution or foam to be applied to the scalp and left in. This should ideally be used twice daily for best results but it helps with hair regrowth when used at least three times per week. Rogaine initially can cause increased hair shedding for the first few weeks but this will stop with continued use. In studies, people who used minoxidil (Rogaine) for at least 6 months had thicker hair than people who did not. Minoxidil topical (Rogaine) only works as long as it continues to be used. If if it is no longer used then the hair it has been helping to regrow can fall out. Minoxidil topical (Rogaine) can cause increased facial hair growth.   Due to recent changes in healthcare laws, you may see results of your pathology and/or laboratory studies on MyChart before the doctors have had a chance to review them. We understand that in some cases there may be results that are confusing or concerning to you. Please understand that not all results are received at the same time and often the doctors may need to interpret multiple results in order to provide you with the best plan of care or course of treatment. Therefore, we ask that you please give us  2 business days to thoroughly review all your results before contacting the office for clarification. Should we see a critical lab result, you will be contacted sooner.   If You Need Anything After Your Visit  If you have any questions or concerns for your doctor, please call our main line at [phone] and press option 4 to reach your doctor's medical assistant. If no one answers, please leave a voicemail as directed and we will return your call as soon as possible. Messages left after 4 pm will be answered the following business day.   You may also  send us  a message via MyChart. We typically respond to MyChart messages within 1-2 business days.  For prescription refills, please ask your pharmacy to contact our office. Our fax number is [phone].  If you have an urgent issue when the clinic is closed that cannot wait until the next business day, you can page your doctor at the number below.    Please note that while we do our best to be available for urgent issues outside of office hours, we are not available 24/7.   If you have an urgent issue and are unable to reach us , you may choose to seek medical care at your doctor's office, retail clinic, urgent care center, or emergency room.  If you have a medical emergency, please immediately call 911 or go to the emergency department.  Pager Numbers  - Dr. Hester: [phone]  - Dr. Jackquline: [phone]  - Dr. Claudene: [phone]   - Dr. Raymund: [phone]  In the event of inclement weather, please call our main line at [phone] for an update on the status of any delays or closures.  Dermatology Medication Tips: Please keep the boxes that topical medications come in in order to help keep track of the instructions about where and how to use these. Pharmacies typically print the medication instructions only on the boxes and not directly on the medication tubes.   If your medication is too expensive, please contact our office at [phone] option 4 or send  us  a message through MyChart.   We are unable to tell what your co-pay for medications will be in advance as this is different depending on your insurance coverage. However, we may be able to find a substitute medication at lower cost or fill out paperwork to get insurance to cover a needed medication.   If a prior authorization is required to get your medication covered by your insurance company, please allow us  1-2 business days to complete this process.  Drug prices often vary depending on where the prescription is  filled and some pharmacies may offer cheaper prices.  The website www.goodrx.com contains coupons for medications through different pharmacies. The prices here do not account for what the cost may be with help from insurance (it may be cheaper with your insurance), but the website can give you the price if you did not use any insurance.  - You can print the associated coupon and take it with your prescription to the pharmacy.  - You may also stop by our office during regular business hours and pick up a GoodRx coupon card.  - If you need your prescription sent electronically to a different pharmacy, notify our office through [facility] or by phone at [phone] option 4.     Si Usted Necesita Algo Despus de Su Visita  Tambin puede enviarnos un mensaje a travs de Clinical cytogeneticist. Por lo general respondemos a los mensajes de MyChart en el transcurso de 1 a 2 das hbiles.  Para renovar recetas, por favor pida a su farmacia que se ponga en contacto con nuestra oficina. Randi lakes de fax es [place] [phone].  Si tiene un asunto urgente cuando la clnica est cerrada y que no puede esperar hasta el siguiente da hbil, puede llamar/localizar a su doctor(a) al nmero que aparece a continuacin.   Por favor, tenga en cuenta que aunque hacemos todo lo posible para estar disponibles para asuntos urgentes fuera del horario de [place], no estamos disponibles las 24 horas del da, los 7 [address]  [address] de la [place].   Si tiene un problema urgente y no puede comunicarse con nosotros, puede optar por buscar atencin mdica  en el consultorio de su doctor(a), en una clnica privada, en un centro de atencin urgente o en una sala de emergencias.  Si tiene Engineer, drilling, por favor llame inmediatamente al 911 o vaya a la sala de emergencias.  Nmeros de bper  - Dr. Hester: [phone]  - Dra. Jackquline: [phone]  - Dr. Claudene: [phone]  - Dra. Kitts: [phone]  En caso de  inclemencias del [place], por favor llame a nuestra lnea principal al [phone] para una actualizacin sobre el estado de cualquier retraso o cierre.  Consejos para la medicacin en dermatologa: Por favor, guarde las cajas en las que vienen los medicamentos de uso tpico para ayudarle a seguir las instrucciones sobre dnde y cmo usarlos. Las farmacias generalmente imprimen las instrucciones del medicamento slo en las cajas y no directamente en los tubos del [place].   Si su medicamento es muy caro, por favor, pngase en contacto con landry rieger llamando al [phone] y presione la opcin 4 o envenos un mensaje a travs de Clinical cytogeneticist.   No podemos decirle cul ser su copago por los medicamentos por adelantado ya que esto es diferente dependiendo de la cobertura de su seguro. Sin embargo, es posible que podamos encontrar un medicamento sustituto a Audiological scientist un formulario para que el seguro cubra el medicamento que se Technical brewer  necesario.   Si se requiere una autorizacin previa para que su compaa de seguros malta su medicamento, por favor permtanos de 1 a 2 das hbiles para completar este proceso.  Los precios de los medicamentos varan con frecuencia dependiendo del Environmental consultant de dnde se surte la receta y alguna farmacias pueden ofrecer precios ms baratos.  El sitio web www.goodrx.com tiene cupones para medicamentos de Health and safety inspector. Los precios aqu no tienen en cuenta lo que podra costar con la ayuda del seguro (puede ser ms barato con su seguro), pero el sitio web puede darle el precio si no utiliz Tourist information centre manager.  - Puede imprimir el cupn correspondiente y llevarlo con su receta a la farmacia.  - Tambin puede pasar por nuestra oficina durante el horario de atencin regular y Education officer, museum una tarjeta de cupones de GoodRx.  - Si necesita que su receta se enve electrnicamente a una farmacia diferente, informe a nuestra oficina a travs de MyChart de [place] o  por telfono llamando al [phone] y presione la opcin 4.

## 2023-12-03 NOTE — Progress Notes (Signed)
   Follow-Up Visit   Subjective  Olivia Sanchez is a 58 y.o. female who presents for the following: intertrigo at inframammary and groin area. Patient needs refills of ketoconazole  and pimecrolimus .    The following portions of the chart were reviewed this encounter and updated as appropriate: medications, allergies, medical history  Review of Systems:  No other skin or systemic complaints except as noted in HPI or Assessment and Plan.  Objective  Well appearing patient in no apparent distress; mood and affect are within normal limits.   A focused examination was performed of the following areas: Inframammary. abdomen  Relevant exam findings are noted in the Assessment and Plan.    Assessment & Plan   INTERTRIGO Exam: Slightly hyperpigmented macerated patches in inframammary and infrapannus folds  Mildly flared  Intertrigo is a chronic recurrent rash that occurs in skin fold areas that may be associated with friction; heat; moisture; yeast; fungus; and bacteria.  It is exacerbated by increased movement / activity; sweating; and higher atmospheric temperature.  Use of an absorbant powder such as Zeasorb AF powder or other OTC antifungal powder to the area daily can prevent rash recurrence. Other options to help keep the area dry include blow drying the area after bathing or using antiperspirant products such as Duradry sweat minimizing gel.  Treatment Plan: Continue ketoconazole  2% cr twice daily prn rash.  Continue pimecrolimus  twice daily prn rash.   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   Recommend minoxidil 5% (Rogaine for men) solution or foam to be  applied to the scalp and left in. This should ideally be used twice daily for best results but it helps with hair regrowth when used at least three times per week. Rogaine initially can cause increased hair shedding for the first few weeks but this will stop with continued use. In studies, people who used minoxidil (Rogaine) for at least 6 months had thicker hair than people who did not. Minoxidil topical (Rogaine) only works as long as it continues to be used. If if it is no longer used then the hair it has been helping to regrow can fall out. Minoxidil topical (Rogaine) can cause increased facial hair growth. Lower risk of unwanted hair growth with topical > systemic  ERYTHEMA INTERTRIGO   Related Medications pimecrolimus  (ELIDEL ) 1 % cream Apply twice daily to affected areas as needed for rash ketoconazole  (NIZORAL ) 2 % cream Apply 1 Application topically to affected areas 2 (two) times daily as needed for rash CENTRAL CENTRIFUGAL SCARRING ALOPECIA    Return in about 1 year (around 12/02/2024) for intertrigo .  LILLETTE Lonell Drones, RMA, am acting as scribe for Boneta Sharps, MD .   Documentation: I have reviewed the above documentation for accuracy and completeness, and I agree with the above.  Boneta Sharps, MD

## 2023-12-04 ENCOUNTER — Other Ambulatory Visit: Payer: Self-pay

## 2023-12-05 ENCOUNTER — Other Ambulatory Visit: Payer: Self-pay

## 2023-12-05 MED ORDER — NURTEC 75 MG PO TBDP
75.0000 mg | ORAL_TABLET | ORAL | 3 refills | Status: DC
  Filled 2023-12-05 – 2023-12-06 (×2): qty 16, 32d supply, fill #0

## 2023-12-06 ENCOUNTER — Other Ambulatory Visit: Payer: Self-pay

## 2023-12-13 ENCOUNTER — Other Ambulatory Visit: Payer: Self-pay

## 2023-12-17 ENCOUNTER — Other Ambulatory Visit: Payer: Self-pay | Admitting: Physician Assistant

## 2023-12-17 ENCOUNTER — Ambulatory Visit
Admission: RE | Admit: 2023-12-17 | Discharge: 2023-12-17 | Disposition: A | Discharge status: Home / Self Care | Source: Ambulatory Visit | Attending: Physician Assistant | Admitting: Physician Assistant

## 2023-12-17 DIAGNOSIS — M79605 Pain in left leg: Secondary | ICD-10-CM | POA: Diagnosis present

## 2023-12-18 ENCOUNTER — Other Ambulatory Visit: Payer: Self-pay

## 2023-12-18 MED ORDER — METHYLPHENIDATE HCL 20 MG PO TABS
20.0000 mg | ORAL_TABLET | Freq: Two times a day (BID) | ORAL | 0 refills | Status: AC
  Filled 2023-12-18: qty 180, 90d supply, fill #0

## 2023-12-21 ENCOUNTER — Other Ambulatory Visit: Payer: Self-pay

## 2023-12-21 MED ORDER — TRAMADOL HCL 50 MG PO TABS
50.0000 mg | ORAL_TABLET | Freq: Two times a day (BID) | ORAL | 0 refills | Status: DC | PRN
  Filled 2023-12-21: qty 14, 7d supply, fill #0

## 2023-12-26 ENCOUNTER — Other Ambulatory Visit: Payer: Self-pay | Admitting: Family Medicine

## 2023-12-26 DIAGNOSIS — M5416 Radiculopathy, lumbar region: Secondary | ICD-10-CM

## 2023-12-27 ENCOUNTER — Ambulatory Visit
Admission: RE | Admit: 2023-12-27 | Discharge: 2023-12-27 | Disposition: A | Discharge status: Home / Self Care | Source: Ambulatory Visit | Attending: Family Medicine | Admitting: Family Medicine

## 2023-12-27 ENCOUNTER — Other Ambulatory Visit: Payer: Self-pay

## 2023-12-27 DIAGNOSIS — M5416 Radiculopathy, lumbar region: Secondary | ICD-10-CM

## 2023-12-27 MED ORDER — TRAMADOL HCL 50 MG PO TABS
50.0000 mg | ORAL_TABLET | Freq: Two times a day (BID) | ORAL | 0 refills | Status: DC | PRN
  Filled 2023-12-27: qty 14, 7d supply, fill #0

## 2024-01-03 ENCOUNTER — Other Ambulatory Visit: Payer: Self-pay

## 2024-01-03 MED ORDER — TRAMADOL HCL 50 MG PO TABS
50.0000 mg | ORAL_TABLET | Freq: Two times a day (BID) | ORAL | 0 refills | Status: DC | PRN
  Filled 2024-01-03: qty 14, 7d supply, fill #0

## 2024-01-07 ENCOUNTER — Other Ambulatory Visit: Payer: Self-pay

## 2024-01-07 MED ORDER — PREDNISONE 10 MG PO TABS
ORAL_TABLET | ORAL | 0 refills | Status: DC
  Filled 2024-01-07: qty 21, 6d supply, fill #0

## 2024-01-07 MED ORDER — TRAMADOL HCL 50 MG PO TABS
50.0000 mg | ORAL_TABLET | Freq: Two times a day (BID) | ORAL | 0 refills | Status: DC | PRN
  Filled 2024-01-07 – 2024-01-11 (×3): qty 30, 15d supply, fill #0

## 2024-01-08 ENCOUNTER — Other Ambulatory Visit: Payer: Self-pay

## 2024-01-08 MED ORDER — TOPIRAMATE 100 MG PO TABS
100.0000 mg | ORAL_TABLET | Freq: Every evening | ORAL | 1 refills | Status: AC
  Filled 2024-01-08: qty 90, 90d supply, fill #0
  Filled 2024-04-02: qty 90, 90d supply, fill #1

## 2024-01-10 ENCOUNTER — Other Ambulatory Visit: Payer: Self-pay

## 2024-01-11 ENCOUNTER — Other Ambulatory Visit: Payer: Self-pay

## 2024-01-11 MED ORDER — QULIPTA 60 MG PO TABS
60.0000 mg | ORAL_TABLET | Freq: Every day | ORAL | 3 refills | Status: AC
  Filled 2024-01-11: qty 30, 30d supply, fill #0
  Filled 2024-02-14: qty 30, 30d supply, fill #1
  Filled 2024-03-24: qty 30, 30d supply, fill #2
  Filled 2024-04-30: qty 30, 30d supply, fill #3

## 2024-01-15 ENCOUNTER — Other Ambulatory Visit: Payer: Self-pay

## 2024-02-07 ENCOUNTER — Other Ambulatory Visit: Payer: Self-pay

## 2024-02-13 ENCOUNTER — Other Ambulatory Visit: Payer: Self-pay

## 2024-02-13 MED ORDER — NA SULFATE-K SULFATE-MG SULF 17.5-3.13-1.6 GM/177ML PO SOLN
1.0000 | ORAL | 0 refills | Status: DC
  Filled 2024-02-13: qty 354, 1d supply, fill #0

## 2024-02-19 ENCOUNTER — Other Ambulatory Visit: Payer: Self-pay

## 2024-03-10 ENCOUNTER — Other Ambulatory Visit: Payer: Self-pay

## 2024-03-11 ENCOUNTER — Other Ambulatory Visit: Payer: Self-pay

## 2024-03-11 MED ORDER — TRAMADOL HCL 50 MG PO TABS
50.0000 mg | ORAL_TABLET | Freq: Two times a day (BID) | ORAL | 0 refills | Status: AC | PRN
  Filled 2024-03-11: qty 14, 7d supply, fill #0

## 2024-03-26 ENCOUNTER — Other Ambulatory Visit: Payer: Self-pay

## 2024-03-26 MED ORDER — METHYLPHENIDATE HCL 20 MG PO TABS
20.0000 mg | ORAL_TABLET | Freq: Two times a day (BID) | ORAL | 0 refills | Status: AC
  Filled 2024-03-26: qty 180, 90d supply, fill #0

## 2024-03-26 MED ORDER — ALPRAZOLAM 0.25 MG PO TABS
0.2500 mg | ORAL_TABLET | Freq: Four times a day (QID) | ORAL | 5 refills | Status: AC
  Filled 2024-03-26: qty 60, 15d supply, fill #0

## 2024-04-08 ENCOUNTER — Encounter
Admission: RE | Disposition: A | Discharge status: Home / Self Care | Payer: Self-pay | Source: Home / Self Care | Attending: Internal Medicine

## 2024-04-08 ENCOUNTER — Ambulatory Visit
Admission: RE | Admit: 2024-04-08 | Discharge: 2024-04-08 | Disposition: A | Attending: Internal Medicine | Admitting: Internal Medicine

## 2024-04-08 ENCOUNTER — Encounter: Payer: Self-pay | Admitting: Internal Medicine

## 2024-04-08 ENCOUNTER — Ambulatory Visit: Payer: Self-pay

## 2024-04-08 DIAGNOSIS — F32A Depression, unspecified: Secondary | ICD-10-CM | POA: Diagnosis not present

## 2024-04-08 DIAGNOSIS — K219 Gastro-esophageal reflux disease without esophagitis: Secondary | ICD-10-CM | POA: Diagnosis not present

## 2024-04-08 DIAGNOSIS — I10 Essential (primary) hypertension: Secondary | ICD-10-CM | POA: Insufficient documentation

## 2024-04-08 DIAGNOSIS — Z1211 Encounter for screening for malignant neoplasm of colon: Secondary | ICD-10-CM | POA: Diagnosis present

## 2024-04-08 DIAGNOSIS — K640 First degree hemorrhoids: Secondary | ICD-10-CM | POA: Diagnosis not present

## 2024-04-08 HISTORY — PX: COLONOSCOPY: SHX5424

## 2024-04-08 MED ORDER — PROPOFOL 10 MG/ML IV BOLUS
INTRAVENOUS | Status: DC | PRN
  Administered 2024-04-08 (×2): 50 mg via INTRAVENOUS

## 2024-04-08 MED ORDER — SODIUM CHLORIDE 0.9 % IV SOLN
INTRAVENOUS | Status: DC
  Administered 2024-04-08: 500 mL via INTRAVENOUS

## 2024-04-08 MED ORDER — PROPOFOL 500 MG/50ML IV EMUL
INTRAVENOUS | Status: DC | PRN
  Administered 2024-04-08: 75 ug/kg/min via INTRAVENOUS

## 2024-04-08 MED ORDER — LIDOCAINE HCL (CARDIAC) PF 100 MG/5ML IV SOSY
PREFILLED_SYRINGE | INTRAVENOUS | Status: DC | PRN
  Administered 2024-04-08: 80 mg via INTRAVENOUS

## 2024-04-08 NOTE — Interval H&P Note (Signed)
 History and Physical Interval Note:  04/08/2024 11:20 AM  Olivia Sanchez  has presented today for surgery, with the diagnosis of Colon cancer screening (Z12.11).  The various methods of treatment have been discussed with the patient and family. After consideration of risks, benefits and other options for treatment, the patient has consented to  Procedures: COLONOSCOPY (N/A) as a surgical intervention.  The patient's history has been reviewed, patient examined, no change in status, stable for surgery.  I have reviewed the patient's chart and labs.  Questions were answered to the patient's satisfaction.     [place], [place]

## 2024-04-08 NOTE — H&P (Signed)
 Outpatient short stay form Pre-procedure 04/08/2024 11:19 AM Olivia Sanchez, M.D.  Primary Physician: Olivia Sanchez, M.D.  Reason for visit:  Colon cancer screening  History of present illness:  LastDate of Colon: 03/16/2014 Physician: Dr. FABIENE Sanchez @ [facility] Polyps Removed: None   Patient denies change in bowel habits, rectal bleeding, weight loss or abdominal pain.    Current Medications[1]  Medications Prior to Admission  Medication Sig Dispense Refill Last Dose/Taking   acetaminophen  (TYLENOL ) 650 MG CR tablet Take 1,300 mg by mouth every 8 (eight) hours as needed for pain.   Past Week   ALPRAZolam  (XANAX ) 0.25 MG tablet Take 1 tablet (0.25 mg total) by mouth every 6 (six) hours as needed for anxiety 60 tablet 5 Past Week   ALPRAZolam  (XANAX ) 0.25 MG tablet Take 1 tablet (0.25 mg total) by mouth every 6 (six) hours as needed for Anxiety 60 tablet 5 04/07/2024 Morning   Atogepant  (QULIPTA ) 60 MG TABS Take 1 tablet (60 mg total) by mouth daily. 30 tablet 3 Past Week   buPROPion  (WELLBUTRIN  XL) 300 MG 24 hr tablet Take 1 tablet (300 mg total) by mouth daily. 90 tablet 2 Past Week   Cholecalciferol (VITAMIN D) 50 MCG (2000 UT) tablet Take 2,000 Units by mouth daily.   Past Week   clotrimazole -betamethasone  (LOTRISONE ) cream Apply 1 application. topically 2 (two) times daily as needed. For external vulvar use, based on symptoms. 30 g 0 Past Week   etodolac  (LODINE ) 400 MG tablet Take 1 tablet (400 mg total) by mouth 2 (two) times daily. 180 tablet 3 04/07/2024 Evening   ketoconazole  (NIZORAL ) 2 % cream Apply 1 Application topically to affected areas 2 (two) times daily as needed for rash 60 g 11 Past Week   latanoprost  (XALATAN ) 0.005 % ophthalmic solution Place 1 drop into both eyes at bedtime. 7.5 mL 3 04/07/2024 Bedtime   methocarbamol  (ROBAXIN ) 500 MG tablet Take 1 tablet (500 mg total) by mouth 2 (two) times daily 180 tablet 3 04/08/2024 Morning   methylphenidate  (RITALIN ) 20 MG tablet  Take 20 mg by mouth 2 (two) times daily.   Past Week   omeprazole  (PRILOSEC) 20 MG capsule Take 1 capsule (20 mg total) by mouth 2 (two) times daily. 180 capsule 1 04/07/2024 Morning   pimecrolimus  (ELIDEL ) 1 % cream Apply twice daily to affected areas as needed for rash 60 g 11 Past Week   propranolol  ER (INDERAL  LA) 80 MG 24 hr capsule Take 1 capsule (80 mg total) by mouth daily. 30 capsule 11 Past Week   rosuvastatin  (CRESTOR ) 10 MG tablet Take 1 tablet (10 mg total) by mouth daily. 90 tablet 3 Past Week   timolol  (TIMOPTIC ) 0.5 % ophthalmic solution Place 1 drop into both eyes 2 (two) times daily. 5 mL 3 04/07/2024 Morning   gabapentin  (NEURONTIN ) 300 MG capsule Take 1 capsule (300 mg total) by mouth at bedtime. (Patient not taking: Reported on 04/08/2024) 30 capsule 0 Not Taking   methylphenidate  (RITALIN ) 20 MG tablet Take 1 tablet (20 mg total) by mouth 2 (two) times daily. 180 tablet 0    methylphenidate  (RITALIN ) 20 MG tablet Take 1 tablet (20 mg total) by mouth 2 (two) times daily 180 tablet 0    nortriptyline  (PAMELOR ) 10 MG capsule TAKE 1 CAPSULE BY MOUTH AT NIGHT FOR 2 WEEKS, THEN TAKE 2 CAPSULES BY MOUTH AT NIGHT. 60 capsule 3    phenazopyridine  (PYRIDIUM ) 200 MG tablet Take 1 tablet (200 mg total) by mouth  3 (three) times daily with meals. 6 tablet 0    QUEtiapine  (SEROQUEL ) 50 MG tablet Take 1 tablet by mouth at bedtime. (Patient not taking: Reported on 04/08/2024)   Not Taking   Rimegepant Sulfate  (NURTEC) 75 MG TBDP Take 1 tablet (75 mg total) by mouth every other day. (Patient not taking: Reported on 04/08/2024) 16 tablet 3 Not Taking   simvastatin (ZOCOR) 40 MG tablet Take 40 mg by mouth every morning.   3    Timolol  Maleate, Once-Daily, 0.5 % SOLN 1 drop.      topiramate  (TOPAMAX ) 100 MG tablet Take 1 tablet by mouth at bedtime. (Patient not taking: Reported on 04/08/2024)   Not Taking   topiramate  (TOPAMAX ) 100 MG tablet Take 1 tablet (100 mg total) by mouth at bedtime. 90 tablet 1     topiramate  (TOPAMAX ) 50 MG tablet Take 1 tablet (50 mg total) by mouth every morning. 90 tablet 3    traMADol  (ULTRAM ) 50 MG tablet Take 1 tablet (50 mg total) by mouth 2 (two) times daily as needed. (Patient not taking: Reported on 04/08/2024) 14 tablet 0 Not Taking   valsartan  (DIOVAN ) 160 MG tablet Take 1 tablet (160 mg total) by mouth daily. (Patient not taking: Reported on 04/08/2024) 90 tablet 3 Not Taking     Allergies[2]   Past Medical History:  Diagnosis Date   Arthritis    Depression    Esophageal reflux    Family history of adverse reaction to anesthesia    maternal aunt and maternal uncle and aunts daughter have malignant hyperthermia   Fibroids    Glaucoma    Herpes genitalia    Hypercholesteremia    Hypertension    Malignant hyperthermia    HUGE FAMILY HISTORY OF MH-PTS MATERNAL AUNT AND UNCLE AND COUSIN-PT HAS NEVER HAD ANY ISSUES BUT SHE HAS ONLY HAD COLONOSCOPY AND EGD   Thyroid  disease     Review of systems:  Otherwise negative.    Physical Exam  Gen: Alert, oriented. Appears stated age.  HEENT: [place]/AT. PERRLA. Lungs: CTA, no wheezes. CV: RR nl S1, S2. Abd: soft, benign, no masses. BS+ Ext: No edema. Pulses 2+    Planned procedures: Proceed with colonoscopy. The patient understands the nature of the planned procedure, indications, risks, alternatives and potential complications including but not limited to bleeding, infection, perforation, damage to internal organs and possible oversedation/side effects from anesthesia. The patient agrees and gives consent to proceed.  Please refer to procedure notes for findings, recommendations and patient disposition/instructions.     Olivia Milligan K. Sanchez, M.D. Gastroenterology 04/08/2024  11:19 AM          [1]  Current Facility-Administered Medications:    0.9 %  sodium chloride  infusion, , Intravenous, Continuous, [place], Morgin Halls K, MD, Last Rate: 20 mL/hr at 04/08/24 1051, Continued from Pre-op at 04/08/24  1051 [2]  Allergies Allergen Reactions   Celecoxib  Other (See Comments)    Blood in the stools

## 2024-04-08 NOTE — Transfer of Care (Signed)
 Immediate Anesthesia Transfer of Care Note  Patient: Olivia Sanchez  Procedure(s) Performed: COLONOSCOPY  Patient Location: PACU  Anesthesia Type:General  Level of Consciousness: sedated  Airway & Oxygen Therapy: Patient Spontanous Breathing  Post-op Assessment: Report given to RN and Post -op Vital signs reviewed and stable  Post vital signs: Reviewed and stable  Last Vitals:  Vitals Value Taken Time  BP 126/94 04/08/24 11:47  Temp    Pulse 103 04/08/24 11:48  Resp 14 04/08/24 11:48  SpO2 100 % 04/08/24 11:48  Vitals shown include unfiled device data.  Last Pain:  Vitals:   04/08/24 1022  TempSrc: Temporal  PainSc: 0-No pain         Complications: No notable events documented.

## 2024-04-08 NOTE — Anesthesia Preprocedure Evaluation (Signed)
 "                                  Anesthesia Evaluation  Patient identified by MRN, date of birth, ID band Patient awake    Reviewed: Allergy & Precautions, NPO status , Patient's Chart, lab work & pertinent test results  History of Anesthesia Complications (+) Family history of anesthesia reaction and history of anesthetic complications (Family history of MH)  Airway Mallampati: III  TM Distance: >3 FB Neck ROM: full    Dental no notable dental hx.    Pulmonary neg pulmonary ROS   Pulmonary exam normal        Cardiovascular hypertension, negative cardio ROS Normal cardiovascular exam     Neuro/Psych  Headaches PSYCHIATRIC DISORDERS  Depression    negative neurological ROS  negative psych ROS   GI/Hepatic Neg liver ROS,GERD  Medicated,,  Endo/Other  negative endocrine ROS    Renal/GU negative Renal ROS  negative genitourinary   Musculoskeletal   Abdominal   Peds  Hematology negative hematology ROS (+)   Anesthesia Other Findings Past Medical History: No date: Arthritis No date: Depression No date: Esophageal reflux No date: Family history of adverse reaction to anesthesia     Comment:  maternal aunt and maternal uncle and aunts daughter have              malignant hyperthermia No date: Fibroids No date: Glaucoma No date: Herpes genitalia No date: Hypercholesteremia No date: Hypertension No date: Malignant hyperthermia     Comment:  HUGE FAMILY HISTORY OF MH-PTS MATERNAL AUNT AND UNCLE               AND COUSIN-PT HAS NEVER HAD ANY ISSUES BUT SHE HAS ONLY               HAD COLONOSCOPY AND EGD No date: Thyroid  disease  Past Surgical History: 02/2014: COLONOSCOPY     Comment:  Dr.Elliott No date: CYSTOSCOPY 04/21/2019: EAR CYST EXCISION; Left     Comment:  Procedure: EXCISION OF CYST FROM LEFT KNEE;  Surgeon:               Mardee Lynwood SQUIBB, MD;  Location: [facility] ORS;  Service:               Orthopedics;  Laterality: Left; No date:  ESOPHAGOGASTRODUODENOSCOPY  BMI    Body Mass Index: 36.21 kg/m      Reproductive/Obstetrics negative OB ROS                              Anesthesia Physical Anesthesia Plan  ASA: 2  Anesthesia Plan: General   Post-op Pain Management: Minimal or no pain anticipated   Induction: Intravenous  PONV Risk Score and Plan: 2 and Propofol  infusion and TIVA  Airway Management Planned: Natural Airway and Nasal Cannula  Additional Equipment:   Intra-op Plan:   Post-operative Plan:   Informed Consent: I have reviewed the patients History and Physical, chart, labs and discussed the procedure including the risks, benefits and alternatives for the proposed anesthesia with the patient or authorized representative who has indicated his/her understanding and acceptance.     Dental Advisory Given  Plan Discussed with: Anesthesiologist, CRNA and Surgeon  Anesthesia Plan Comments: (Patient consented for risks of anesthesia including but not limited to:  - adverse reactions to medications - risk of airway  placement if required - damage to eyes, teeth, lips or other oral mucosa - nerve damage due to positioning  - sore throat or hoarseness - Damage to heart, brain, nerves, lungs, other parts of body or loss of life  Patient voiced understanding and assent.)        Anesthesia Quick Evaluation  "

## 2024-04-08 NOTE — Op Note (Signed)
 [facility] Patient Name: Olivia Sanchez Procedure Date: 04/08/2024 11:25 AM MRN: [id number] Account #: [id number] Date of Birth: Oct 06, 1965 Admit Type: Outpatient Age: 59 Room: Olivia Sanchez ENDO ROOM 1 Gender: Female Note Status: Finalized Instrument Name: Colon Scope [id number] Procedure:             Colonoscopy Indications:           Screening for colorectal malignant neoplasm Providers:             Ambriella Kitt K. Aundria MD, MD Referring MD:          Grover Woodfield K. Aundria MD, MD (Referring MD), Reyes BIRCH.                         Auston, MD (Referring MD) Medicines:             Propofol  per Anesthesia Complications:         No immediate complications. Estimated blood loss: None. Procedure:             Pre-Anesthesia Assessment:                        - The risks and benefits of the procedure and the                         sedation options and risks were discussed with the                         patient. All questions were answered and informed                         consent was obtained.                        - Patient identification and proposed procedure were                         verified prior to the procedure. The procedure was                         verified in the procedure room.                        - ASA Grade Assessment: II - A patient with mild                         systemic disease.                        - After reviewing the risks and benefits, the patient                         was deemed in satisfactory condition to undergo the                         procedure.                        After obtaining informed consent, the colonoscope was  passed under direct vision. Throughout the procedure,                         the patient's blood pressure, pulse, and oxygen                         saturations were monitored continuously. The                         Colonoscope was introduced through the anus and                          advanced to the the cecum, identified by appendiceal                         orifice and ileocecal valve. The colonoscopy was                         performed without difficulty. The patient tolerated                         the procedure well. The quality of the bowel                         preparation was good. The ileocecal valve, appendiceal                         orifice, and rectum were photographed. Findings:      The perianal and digital rectal examinations were normal. Pertinent       negatives include normal sphincter tone and no palpable rectal lesions.      Non-bleeding internal hemorrhoids were found during retroflexion. The       hemorrhoids were Grade I (internal hemorrhoids that do not prolapse).      The exam was otherwise without abnormality. Impression:            - Non-bleeding internal hemorrhoids.                        - The examination was otherwise normal.                        - No specimens collected. Recommendation:        - Patient has a contact number available for                         emergencies. The signs and symptoms of potential                         delayed complications were discussed with the patient.                         Return to normal activities tomorrow. Written                         discharge instructions were provided to the patient.                        - Resume previous diet.                        -  Continue present medications.                        - Repeat colonoscopy in 10 years for screening                         purposes.                        - Return to GI office PRN.                        - The findings and recommendations were discussed with                         the patient. Procedure Code(s):     --- Professional ---                        H9878, Colorectal cancer screening; colonoscopy on                         individual not meeting criteria for high risk Diagnosis Code(s):     ---  Professional ---                        K64.0, First degree hemorrhoids                        Z12.11, Encounter for screening for malignant neoplasm                         of colon CPT copyright 2022 [employer]. All rights reserved. The codes documented in this report are preliminary and upon coder review may  be revised to meet current compliance requirements. [name] MD, MD 04/08/2024 11:46:02 AM This report has been signed electronically. Number of Addenda: 0 Note Initiated On: 04/08/2024 11:25 AM Scope Withdrawal Time: 0 hours 5 minutes 58 seconds  Total Procedure Duration: 0 hours 9 minutes 32 seconds  Estimated Blood Loss:  Estimated blood loss: none.      Olivia Sanchez

## 2024-04-08 NOTE — Anesthesia Postprocedure Evaluation (Signed)
"   Anesthesia Post Note  Patient: Olivia Sanchez  Procedure(s) Performed: COLONOSCOPY  Patient location during evaluation: Endoscopy Anesthesia Type: General Level of consciousness: awake and alert Pain management: pain level controlled Vital Signs Assessment: post-procedure vital signs reviewed and stable Respiratory status: spontaneous breathing, nonlabored ventilation, respiratory function stable and patient connected to nasal cannula oxygen Cardiovascular status: blood pressure returned to baseline and stable Postop Assessment: no apparent nausea or vomiting Anesthetic complications: no   No notable events documented.   Last Vitals:  Vitals:   04/08/24 1208 04/08/24 1217  BP: 132/88 (!) 129/90  Pulse:    Resp:    Temp:    SpO2: 100% 100%    Last Pain:  Vitals:   04/08/24 1217  TempSrc:   PainSc: 0-No pain                 Olivia Sanchez      "

## 2024-04-21 ENCOUNTER — Ambulatory Visit: Payer: Self-pay | Admitting: Psychiatry

## 2024-04-21 ENCOUNTER — Encounter: Payer: Self-pay | Admitting: Psychiatry

## 2024-04-21 DIAGNOSIS — Z634 Disappearance and death of family member: Secondary | ICD-10-CM | POA: Insufficient documentation

## 2024-04-21 DIAGNOSIS — Z8659 Personal history of other mental and behavioral disorders: Secondary | ICD-10-CM | POA: Diagnosis not present

## 2024-04-21 DIAGNOSIS — F3341 Major depressive disorder, recurrent, in partial remission: Secondary | ICD-10-CM | POA: Insufficient documentation

## 2024-04-21 DIAGNOSIS — G4700 Insomnia, unspecified: Secondary | ICD-10-CM | POA: Diagnosis not present

## 2024-04-21 NOTE — Progress Notes (Unsigned)
 Virtual Visit via Video Note  I connected with Olivia Sanchez on 04/21/24 at  9:30 AM EST by a video enabled telemedicine application and verified that I am speaking with the correct person using two identifiers.  Location Provider Location : [facility] Patient Location : Home  Participants: Patient , Provider   I discussed the limitations of evaluation and management by telemedicine and the availability of in person appointments. The patient expressed understanding and agreed to proceed.    I discussed the assessment and treatment plan with the patient. The patient was provided an opportunity to ask questions and all were answered. The patient agreed with the plan and demonstrated an understanding of the instructions.   The patient was advised to call back or seek an in-person evaluation if the symptoms worsen or if the condition fails to improve as anticipated.    Psychiatric Initial Adult Assessment   Patient Identification: Olivia Sanchez MRN:  [id number] Date of Evaluation:  04/21/2024 Referral Source: Reyes Costa MD Chief Complaint:   Chief Complaint  Patient presents with   Establish Care   Anxiety   Depression   Medication Refill   Visit Diagnosis:    ICD-10-CM   1. Bereavement  Z63.4     2. Insomnia, unspecified type  G47.00     3. History of depression  Z86.59       History of Present Illness:  Olivia Sanchez is a 59 year old African-American female, single, employed, lives in [place], has a history of depression, insomnia, gastroesophageal reflux disease, hyperlipidemia, hypertension, hypothyroidism, migraine headaches, was evaluated by telemedicine today.  Following the recent death of her mother on 12/09/26from dehydration related to dementia in a nursing home, she reports significant grief and sadness. She describes feeling responsible for not visiting more often and expresses guilt and self-criticism regarding her mother's passing. According to her, her  current struggles primarily relate to this bereavement, though she continues to maintain daily functioning, including working from home as a engineer, civil (consulting), attending to self-care, and eating regularly.  She does report a history of depression on and off since 2012.  She is currently under the care of primary care provider for management of the same and is on medications like Wellbutrin  which she believes is keeping her depression symptoms manageable.  Periods of elevated mood, characterized by increased laughter and excessive buying of items--particularly shoes--occur, but she denies episodes of abnormally high energy, decreased need for sleep, or feeling 'on top of the world.' She states that these behaviors do not reach a level that others find concerning. Buying behavior occurs regardless of mood, and she notes some accumulation of belongings results from inheriting items from family members.  She reports a previous psychiatrist Dr. Aleene Dan likely diagnosed her with bipolar disorder.  She however currently is unable to give details about any significant mania or hypomanic symptoms.  This needs to be explored further in future sessions.  Ongoing sleep difficulties, which she attributes in part to a recent switch from night shift to day shift work, persist. She describes fragmented sleep, typically sleeping for 2 hours at a time, waking for an hour, and then returning to sleep. She uses Unisom and alprazolam  0.25 mg every 6 hours as needed to assist with sleep, but reports these do not help. She also started CPAP therapy for sleep apnea on January 9th and is in the process of adjusting to the device.  She is on methylphenidate  which was added as per patient by  primary care provider to help with the energy level during the day.  Previous trials of amitriptyline  and Seroquel  for sleep did not provide benefit, and she reports that melatonin has not helped.   She denies any current suicidality, homicidality or  perceptual disturbances.  She denies any history of hallucinations, delusions, obsessive-compulsive symptoms, or eating disorder behaviors. She denies current or past self-injurious behaviors .  Denies any history of trauma.    Associated Signs/Symptoms: Depression Symptoms:  Grieving the loss of her mother, insomnia (Hypo) Manic Symptoms:  Denies any manic or hypomanic symptoms Anxiety Symptoms:  Currently denies Psychotic Symptoms:  Denies PTSD Symptoms: Negative  Past Psychiatric History: She previously was under the care of psychiatrist, Dr. Aleene Dan from 2014 to 2017 for depression and possible bipolar disorder. Past medication trials included Celexa, Lexapro, lamotrigine, lithium, and Depakote; she reports discontinuing most due to weight gain. She reports a hospitalization after ingesting 16 Tylenol  tablets in 2012, stating her intent was to sleep rather than to die. She attended [facility] following the 2012 incident.  She denies any self-injurious behaviors like cutting.  Previous Psychotropic Medications: Yes noted above.  Substance Abuse History in the last 12 months:  No. She reports social alcohol use, not regular. Consequences of Substance Abuse: Negative  Past Medical History:  Past Medical History:  Diagnosis Date   Arthritis    Depression    Esophageal reflux    Family history of adverse reaction to anesthesia    maternal aunt and maternal uncle and aunts daughter have malignant hyperthermia   Fibroids    Glaucoma    Herpes genitalia    Hypercholesteremia    Hypertension    Malignant hyperthermia    HUGE FAMILY HISTORY OF MH-PTS MATERNAL AUNT AND UNCLE AND COUSIN-PT HAS NEVER HAD ANY ISSUES BUT SHE HAS ONLY HAD COLONOSCOPY AND EGD   Thyroid  disease     Past Surgical History:  Procedure Laterality Date   COLONOSCOPY  02/2014   Dr.Elliott   COLONOSCOPY N/A 04/08/2024   Procedure: COLONOSCOPY;  Surgeon: Toledo, Ladell POUR, MD;  Location: [facility] ENDOSCOPY;  Service:  Gastroenterology;  Laterality: N/A;   CYSTOSCOPY     EAR CYST EXCISION Left 04/21/2019   Procedure: EXCISION OF CYST FROM LEFT KNEE;  Surgeon: Mardee Lynwood SQUIBB, MD;  Location: [facility] ORS;  Service: Orthopedics;  Laterality: Left;   ESOPHAGOGASTRODUODENOSCOPY      Family Psychiatric History: ***  Family History:  Family History  Problem Relation Age of Onset   Dementia Mother        senile, early stages   Hypertension Mother    Drug abuse Father    Breast cancer Maternal Aunt 49    Social History:   Social History   Socioeconomic History   Marital status: Single    Spouse name: Not on file   Number of children: Not on file   Years of education: Not on file   Highest education level: Bachelor's degree (e.g., BA, AB, BS)  Occupational History   Not on file  Tobacco Use   Smoking status: Never   Smokeless tobacco: Never  Vaping Use   Vaping status: Never Used  Substance and Sexual Activity   Alcohol use: Not Currently   Drug use: No   Sexual activity: Not Currently    Partners: Male    Birth control/protection: None  Other Topics Concern   Not on file  Social History Narrative   Not on file   Social Drivers of Health  Tobacco Use: Low Risk (04/21/2024)   Patient History    Smoking Tobacco Use: Never    Smokeless Tobacco Use: Never    Passive Exposure: Not on file  Financial Resource Strain: Medium Risk (12/21/2023)   [facility]   Overall Financial Resource Strain (CARDIA)    Difficulty of Paying Living Expenses: Somewhat hard  Food Insecurity: No Food Insecurity (12/21/2023)   Received from [facility]   Epic    Within the past 12 months, you worried that your food would run out before you got the money to buy more.: Never true    Within the past 12 months, the food you bought just didn't last and you didn't have money to get more.: Never true  Transportation Needs: No Transportation Needs (12/21/2023)   Received  from [facility] - Transportation    In the past 12 months, has lack of transportation kept you from medical appointments or from getting medications?: No    Lack of Transportation (Non-Medical): No  Physical Activity: Not on file  Stress: Not on file  Social Connections: Not on file  Depression (EYV7-0): Not on file  Alcohol Screen: Not on file  Housing: Low Risk  (12/21/2023)   Received from [facility]   Epic    In the last 12 months, was there a time when you were not able to pay the mortgage or rent on time?: No    In the past 12 months, how many times have you moved where you were living?: 0    At any time in the past 12 months, were you homeless or living in a shelter (including now)?: No  Recent Concern: Housing - High Risk (11/15/2023)   Received from [facility]   Epic    In the last 12 months, was there a time when you were not able to pay the mortgage or rent on time?: Yes    In the past 12 months, how many times have you moved where you were living?: 0    At any time in the past 12 months, were you homeless or living in a shelter (including now)?: No  Utilities: Not At Risk (12/21/2023)   Received from [facility]   Epic    In the past 12 months has the electric, gas, oil, or water company threatened to shut off services in your home?: No  Health Literacy: Not on file    Additional Social History: She was born and raised in [place] primarily by maternal grandmother. She completed a BA in psychology at [employer] and obtained a nursing degree in 2017. 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 Previous employment includes retail, fast food, and behavioral health. She lives alone at home, is single, has never married, and denies having children. She has 2 half sisters and reports a relationship with them. She identifies as religious. She has  no history of military service or legal issues. After working night shift for 25 years, she transitioned to day shift 1 year ago.  She denies access to firearm.  Allergies:  Allergies[1]  Metabolic Disorder Labs: No results found for: HGBA1C, MPG No results found for: PROLACTIN Lab Results  Component Value Date   CHOL 198 06/08/2016   TRIG 93 06/08/2016   HDL 62 06/08/2016   CHOLHDL 3.2 06/08/2016   VLDL  19 06/08/2016   LDLCALC 117 (H) 06/08/2016   Lab Results  Component Value Date   TSH 5.489 (H) 06/08/2016    Therapeutic Level Labs: No results found for: LITHIUM No results found for: CBMZ No results found for: VALPROATE  Current Medications: Current Outpatient Medications  Medication Sig Dispense Refill   acetaminophen  (TYLENOL ) 650 MG CR tablet Take 1,300 mg by mouth every 8 (eight) hours as needed for pain.     ALPRAZolam  (XANAX ) 0.25 MG tablet Take 1 tablet (0.25 mg total) by mouth every 6 (six) hours as needed for anxiety 60 tablet 5   Atogepant  (QULIPTA ) 60 MG TABS Take 1 tablet (60 mg total) by mouth daily. 30 tablet 3   buPROPion  (WELLBUTRIN  XL) 300 MG 24 hr tablet Take 1 tablet (300 mg total) by mouth daily. 90 tablet 2   Cholecalciferol (VITAMIN D) 50 MCG (2000 UT) tablet Take 2,000 Units by mouth daily.     clotrimazole -betamethasone  (LOTRISONE ) cream Apply 1 application. topically 2 (two) times daily as needed. For external vulvar use, based on symptoms. 30 g 0   etodolac  (LODINE ) 400 MG tablet Take 1 tablet (400 mg total) by mouth 2 (two) times daily. 180 tablet 3   ketoconazole  (NIZORAL ) 2 % cream Apply 1 Application topically to affected areas 2 (two) times daily as needed for rash 60 g 11   latanoprost  (XALATAN ) 0.005 % ophthalmic solution Place 1 drop into both eyes at bedtime. 7.5 mL 3   methocarbamol  (ROBAXIN ) 500 MG tablet Take 1 tablet (500 mg total) by mouth 2 (two) times daily 180 tablet 3   methylphenidate  (RITALIN ) 20 MG tablet Take 20 mg  by mouth 2 (two) times daily.     methylphenidate  (RITALIN ) 20 MG tablet Take 1 tablet (20 mg total) by mouth 2 (two) times daily. 180 tablet 0   methylphenidate  (RITALIN ) 20 MG tablet Take 1 tablet (20 mg total) by mouth 2 (two) times daily 180 tablet 0   omeprazole  (PRILOSEC) 20 MG capsule Take 1 capsule (20 mg total) by mouth 2 (two) times daily. 180 capsule 1   propranolol  ER (INDERAL  LA) 80 MG 24 hr capsule Take 1 capsule (80 mg total) by mouth daily. 30 capsule 11   rosuvastatin  (CRESTOR ) 10 MG tablet Take 1 tablet (10 mg total) by mouth daily. 90 tablet 3   timolol  (TIMOPTIC ) 0.5 % ophthalmic solution Place 1 drop into both eyes 2 (two) times daily. 5 mL 3   Timolol  Maleate, Once-Daily, 0.5 % SOLN 1 drop.     topiramate  (TOPAMAX ) 100 MG tablet Take 1 tablet (100 mg total) by mouth at bedtime. 90 tablet 1   topiramate  (TOPAMAX ) 50 MG tablet Take 1 tablet (50 mg total) by mouth every morning. 90 tablet 3   traMADol  (ULTRAM ) 50 MG tablet Take 1 tablet (50 mg total) by mouth 2 (two) times daily as needed. 14 tablet 0   valsartan  (DIOVAN ) 160 MG tablet Take 1 tablet (160 mg total) by mouth daily. 90 tablet 3   ALPRAZolam  (XANAX ) 0.25 MG tablet Take 1 tablet (0.25 mg total) by mouth every 6 (six) hours as needed for Anxiety (Patient not taking: Reported on 04/21/2024) 60 tablet 5   topiramate  (TOPAMAX ) 100 MG tablet Take 1 tablet by mouth at bedtime. (Patient not taking: Reported on 04/21/2024)     No current facility-administered medications for this visit.    Musculoskeletal: Strength & Muscle Tone: UTA Gait & Station: Seated Patient leans: N/A  Psychiatric Specialty  Exam: Review of Systems  Psychiatric/Behavioral:  Positive for sleep disturbance.        Grieving    There were no vitals taken for this visit.There is no height or weight on file to calculate BMI.  General Appearance: Casual  Eye Contact:  Fair  Speech:  Clear and Coherent  Volume:  Normal  Mood:  grief  Affect:   Congruent  Thought Process:  Goal Directed and Descriptions of Associations: Intact  Orientation:  Full (Time, Place, and Person)  Thought Content:  Logical  Suicidal Thoughts:  No  Homicidal Thoughts:  No  Memory:  Immediate;   Fair Recent;   Fair Remote;   Fair  Judgement:  Fair  Insight:  Fair  Psychomotor Activity:  Normal  Concentration:  Concentration: Fair and Attention Span: Fair  Recall:  [employer] of Knowledge:Fair  Language: Fair  Akathisia:  No  Handed:  Right  AIMS (if indicated):  not done  Assets:  Communication Skills Desire for Improvement Housing Social Support Talents/Skills Transportation  ADL's:  Intact  Cognition: WNL  Sleep:  Poor   Screenings: GAD-7    Flowsheet Row [facility]/18/[facility]  Total GAD-7 Score 2   PHQ2-9    Flowsheet Row Office Visit from 01/18/2017 in [facility]  PHQ-2 Total Score 3  PHQ-9 Total Score 7   Flowsheet Row Office Visit from 04/21/2024 in [facility] Admission (Discharged) from [facility]/08/2024 [facility] from [facility]/08/2022 in [facility] Department [facility]  C-SSRS RISK CATEGORY No Risk No Risk No Risk    Assessment and Plan: Olivia Sanchez is a 59 year old African-American female who presented for a psychiatric evaluation.  Discussed assessment and plan as noted below.  Bereavement -unstable She is experiencing an acute grief reaction following the death of her mother on 02/13/24, due to dehydration in a nursing home. She feels guilt and self-blame, believing she could have done more to prevent the death. There is no current suicidal ideation or self-harm behavior. Grief affects her emotional well-being but does not interfere with daily functioning.  Referred to a therapist for grief counseling and support.  History of history of depression-currently stable on  medications She has chronic major depressive disorder with episodes since 2012, initially triggered by a personal relationship ending. Currently, she is experiencing grief-related depression without functional impairment. There is no current suicidal ideation. Previously diagnosed with possible bipolar disorder due to mood fluctuations, but no current manic episodes. She is on Wellbutrin  XL 300 mg.  Continue Wellbutrin  XL 300 mg daily and referred to a therapist for ongoing depression management.  Insomnia-unstable She has chronic insomnia with difficulty maintaining sleep, worsened by a recent shift from night to day work schedule. She uses Unisom and CPAP for sleep apnea, started on January 9th, 2025. She reports sleeping in short intervals and difficulty adjusting to the new schedule. Previous trials of amitriptyline  and Seroquel  were ineffective. Melatonin was ineffective, but melatonin combination medications have not been tried.  Continue using CPAP for sleep apnea, consider melatonin combination medications 90 minutes before bedtime, provided information on sleep hygiene, and advised against using Xanax  for sleep due to potential long-term risk of benzodiazepine therapy.  I have reviewed labs including TSH dated 03/26/2024-within normal limits.  I have reviewed notes per Dr. Reyes Sparks-dated 02/27/2024.  Follow-up Patient is currently interested in psychotherapy.  Prefers to continue medication management  with Dr. Auston.  Follow-up in clinic as needed.  Patient has been referred to in-house therapist.  Collaboration of Care: Referral or follow-up with counselor/therapist AEB I have communicated with staff to schedule this patient with in-house therapist.  Patient/Guardian was advised Release of Information must be obtained prior to any record release in order to collaborate their care with an outside provider. Patient/Guardian was advised if they have not already done so to contact the  registration department to sign all necessary forms in order for us  to release information regarding their care.   Consent: Patient/Guardian gives verbal consent for treatment and assignment of benefits for services provided during this visit. Patient/Guardian expressed understanding and agreed to proceed.  Discussed the use of a AI scribe software for clinical note transcription with the patient, who gave verbal consent to proceed.   This note was generated in part or whole with voice recognition software. Voice recognition is usually quite accurate but there are transcription errors that can and very often do occur. I apologize for any typographical errors that were not detected and corrected.    Lynnett Langlinais, MD 1/26/202610:59 AM     [1]  Allergies Allergen Reactions   Celecoxib  Other (See Comments)    Blood in the stools

## 2024-05-01 ENCOUNTER — Other Ambulatory Visit: Payer: Self-pay

## 2024-12-02 ENCOUNTER — Ambulatory Visit: Admitting: Dermatology
